# Patient Record
Sex: Female | Born: 1940 | Race: White | Hispanic: No | Smoking: Never smoker
Health system: Southern US, Community
[De-identification: ages and names within clinical notes are randomized; demographics above are authoritative.]

## PROBLEM LIST (undated history)

## (undated) DIAGNOSIS — E039 Hypothyroidism, unspecified: Secondary | ICD-10-CM

## (undated) DIAGNOSIS — K589 Irritable bowel syndrome without diarrhea: Secondary | ICD-10-CM

## (undated) DIAGNOSIS — J189 Pneumonia, unspecified organism: Secondary | ICD-10-CM

## (undated) DIAGNOSIS — R911 Solitary pulmonary nodule: Secondary | ICD-10-CM

## (undated) DIAGNOSIS — K5790 Diverticulosis of intestine, part unspecified, without perforation or abscess without bleeding: Secondary | ICD-10-CM

## (undated) DIAGNOSIS — E785 Hyperlipidemia, unspecified: Secondary | ICD-10-CM

## (undated) DIAGNOSIS — I499 Cardiac arrhythmia, unspecified: Secondary | ICD-10-CM

## (undated) DIAGNOSIS — K635 Polyp of colon: Secondary | ICD-10-CM

## (undated) HISTORY — DX: Hyperlipidemia, unspecified: E78.5

## (undated) HISTORY — DX: Solitary pulmonary nodule: R91.1

## (undated) HISTORY — DX: Pneumonia, unspecified organism: J18.9

## (undated) HISTORY — DX: Diverticulosis of intestine, part unspecified, without perforation or abscess without bleeding: K57.90

## (undated) HISTORY — DX: Polyp of colon: K63.5

## (undated) HISTORY — DX: Hypothyroidism, unspecified: E03.9

## (undated) HISTORY — DX: Cardiac arrhythmia, unspecified: I49.9

## (undated) HISTORY — DX: Irritable bowel syndrome without diarrhea: K58.9

---
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
## 1979-07-20 HISTORY — PX: ABDOMINAL HYSTERECTOMY: SHX81

## 1979-07-20 HISTORY — PX: APPENDECTOMY: SHX54

## 1988-07-19 HISTORY — PX: THYROID SURGERY: SHX805

## 1993-07-19 DIAGNOSIS — E039 Hypothyroidism, unspecified: Secondary | ICD-10-CM

## 1993-07-19 DIAGNOSIS — I499 Cardiac arrhythmia, unspecified: Secondary | ICD-10-CM

## 1993-07-19 HISTORY — DX: Cardiac arrhythmia, unspecified: I49.9

## 1993-07-19 HISTORY — DX: Hypothyroidism, unspecified: E03.9

## 1999-07-20 DIAGNOSIS — J189 Pneumonia, unspecified organism: Secondary | ICD-10-CM

## 1999-07-20 HISTORY — DX: Pneumonia, unspecified organism: J18.9

## 2000-07-19 HISTORY — PX: VEIN SURGERY: SHX48

## 2001-12-27 ENCOUNTER — Encounter: Payer: Self-pay | Admitting: Pulmonary Disease

## 2002-02-26 ENCOUNTER — Encounter: Payer: Self-pay | Admitting: Pulmonary Disease

## 2002-07-23 ENCOUNTER — Encounter: Payer: Self-pay | Admitting: Pulmonary Disease

## 2003-01-07 ENCOUNTER — Encounter: Payer: Self-pay | Admitting: Pulmonary Disease

## 2003-01-29 ENCOUNTER — Encounter: Payer: Self-pay | Admitting: Pulmonary Disease

## 2003-02-04 ENCOUNTER — Encounter (INDEPENDENT_AMBULATORY_CARE_PROVIDER_SITE_OTHER): Payer: Self-pay | Admitting: Specialist

## 2003-02-04 ENCOUNTER — Encounter: Payer: Self-pay | Admitting: Pulmonary Disease

## 2003-02-04 ENCOUNTER — Ambulatory Visit: Admission: RE | Admit: 2003-02-04 | Discharge: 2003-02-04 | Payer: Self-pay | Admitting: Pulmonary Disease

## 2003-02-04 ENCOUNTER — Encounter (INDEPENDENT_AMBULATORY_CARE_PROVIDER_SITE_OTHER): Payer: Self-pay

## 2003-07-20 DIAGNOSIS — E785 Hyperlipidemia, unspecified: Secondary | ICD-10-CM

## 2003-07-20 HISTORY — DX: Hyperlipidemia, unspecified: E78.5

## 2003-08-28 ENCOUNTER — Encounter: Payer: Self-pay | Admitting: Pulmonary Disease

## 2004-03-17 ENCOUNTER — Encounter: Payer: Self-pay | Admitting: Pulmonary Disease

## 2004-12-07 ENCOUNTER — Encounter: Payer: Self-pay | Admitting: Pulmonary Disease

## 2004-12-31 ENCOUNTER — Ambulatory Visit: Payer: Self-pay | Admitting: Pulmonary Disease

## 2005-05-21 ENCOUNTER — Ambulatory Visit: Payer: Self-pay | Admitting: Pulmonary Disease

## 2006-06-23 ENCOUNTER — Ambulatory Visit: Payer: Self-pay | Admitting: Pulmonary Disease

## 2007-05-25 ENCOUNTER — Ambulatory Visit: Payer: Self-pay | Admitting: Pulmonary Disease

## 2007-09-20 DIAGNOSIS — Z87898 Personal history of other specified conditions: Secondary | ICD-10-CM | POA: Insufficient documentation

## 2007-09-20 DIAGNOSIS — J984 Other disorders of lung: Secondary | ICD-10-CM

## 2007-09-20 DIAGNOSIS — J45909 Unspecified asthma, uncomplicated: Secondary | ICD-10-CM

## 2008-06-24 ENCOUNTER — Ambulatory Visit: Payer: Self-pay | Admitting: Pulmonary Disease

## 2008-06-24 DIAGNOSIS — Z2239 Carrier of other specified bacterial diseases: Secondary | ICD-10-CM | POA: Insufficient documentation

## 2008-07-04 ENCOUNTER — Telehealth (INDEPENDENT_AMBULATORY_CARE_PROVIDER_SITE_OTHER): Payer: Self-pay | Admitting: *Deleted

## 2009-06-23 ENCOUNTER — Ambulatory Visit: Payer: Self-pay | Admitting: Pulmonary Disease

## 2009-07-21 ENCOUNTER — Telehealth (INDEPENDENT_AMBULATORY_CARE_PROVIDER_SITE_OTHER): Payer: Self-pay | Admitting: *Deleted

## 2010-06-23 ENCOUNTER — Ambulatory Visit: Payer: Self-pay | Admitting: Pulmonary Disease

## 2010-08-18 NOTE — Progress Notes (Signed)
Summary: Advair RX  Phone Note Call from Patient Call back at [facility] Phone [phone]   Caller: Patient Call For: clance Reason for Call: Refill Medication, Talk to Nurse Summary of Call: dry cough gone, was on discus of Advair.  KC gave samples of Advair puffers and they work much better.  Need him to call her an RX in for Advair puffers. [facility] - [phone] Initial call taken by: Eugene Gavia,  July 21, 2009 8:23 AM  Follow-up for Phone Call        LMOMTCB---x1 [facility]ackery [facility]  July 21, 2009 12:37 PM  The patient says she is doing well on the Advair HFA 115/21, 2 puffs two times a day and would like this sent to her pharmacy. She states her cough is gone. Okay to send RX?Michel Bickers CMA  July 23, 2009 9:34 AM   Additional Follow-up for Phone Call Additional follow up Details #1::        ok with me...she can get a years worth. Additional Follow-up by: Barbaraann Share MD,  July 23, 2009 1:54 PM    Additional Follow-up for Phone Call Additional follow up Details #2::    LMOM to let pt know RX had been called to the pharmacy and she should call them to make sure it is ready before going to pick it up.Michel Bickers CMA  July 23, 2009 2:19 PM  New/Updated Medications: ADVAIR HFA 115-21 MCG/ACT AERO (FLUTICASONE-SALMETEROL) 2 puffs two times a day and rinse well Prescriptions: ADVAIR HFA 115-21 MCG/ACT AERO (FLUTICASONE-SALMETEROL) 2 puffs two times a day and rinse well  #1 x 11   Entered by:   Michel Bickers CMA   Authorized by:   Barbaraann Share MD   Signed by:   Michel Bickers CMA on 07/23/2009   Method used:   Telephoned to ...         RxID:   [id number]

## 2010-08-20 NOTE — Assessment & Plan Note (Signed)
Summary: rov for asthma, nodules.   Visit Type:  Follow-up Primary Provider/Referring Provider:  Gae Gallop [facility])  CC:  1 year follow up. pt states her breathing has been doing pretty good. pt denies any cough. pt states she quit the advair due to her mouth was raw and it was irritating. pt is never smoker.  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  She has been doing very well from a breathing standpoint, and has discontinued using her advair due to ongoing thrush despite rinsing.  She has seen no change in her breathing, and has not used her rescue inhaler.  Her exertional tolerance is at baseline.  She has denied cough, congestion, or purulence.  She has been eating well, and is not losing significant weight .  She is due for a f/u cxr for her h/o nodules.  Current Medications (verified): 1)  Celebrex 200 Mg  Caps (Celecoxib) .... Take 1 Tablet By Mouth Once A Day 2)  Allegra 180 Mg  Tabs (Fexofenadine Hcl) .... Take 1 Tablet By Mouth Once A Day 3)  Synthroid 75 Mcg  Tabs (Levothyroxine Sodium) .... Take 1 Tablet By Mouth Once A Day 4)  Vitamin D 1000 Unit Caps (Cholecalciferol) .... Take 1 Tablet By Mouth Once A Day 5)  Ventolin Hfa 108 (90 Base) Mcg/act  Aers (Albuterol Sulfate) .Marland Kitchen.. 1-2 Puffs Every 4-6 Hours As Needed 6)  Tessalon Perles 100 Mg  Caps (Benzonatate) .... One To Two By Mouth 3-4 Times Daily If Needed For Cough  Allergies (verified): No Known Drug Allergies  Review of Systems       The patient complains of shortness of breath with activity, irregular heartbeats, acid heartburn, indigestion, and joint stiffness or pain.  The patient denies shortness of breath at rest, non-productive cough, coughing up blood, chest pain, loss of appetite, weight change, abdominal pain, difficulty swallowing, sore throat, tooth/dental problems, headaches, nasal congestion/difficulty  breathing through nose, sneezing, itching, ear ache, anxiety, depression, hand/feet swelling, rash, change in color of mucus, and fever.    Vital Signs:  Patient profile:   70 year old female Height:      68.5 inches Weight:      160.50 pounds BMI:     24.14 O2 Sat:      98 % on Room air Temp:     97.5 degrees F oral Pulse rate:   65 / minute BP sitting:   118 / 76  (left arm) Cuff size:   regular  Vitals Entered By: Carver Fila (June 23, 2010 8:52 AM)  O2 Flow:  Room air CC: 1 year follow up. pt states her breathing has been doing pretty good. pt denies any cough. pt states she quit the advair due to her mouth was raw and it was irritating. pt is never smoker Comments meds and allergies updated Phone number updated Carver Fila  June 23, 2010 8:52 AM    Physical Exam  General:  wd female in nad Lungs:  totally clear to auscultation no wheezing Heart:  rrr, no mrg Extremities:  no edema or cyanosis  Neurologic:  alert and oriented, moves all 4.   Impression & Recommendations:  Problem # 1:  ASTHMA (ICD-493.90) the pt has been asymptomatic since discontinuing advair.  I am willing to give her time off the med as long as symptoms do not recur, and have asked  her to play close attention.  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  They have not changed on serial cxr's.  Will check one today, and consider whether she needs ongoing f/u.    Other Orders: Est. Patient Level III (13086) T-2 View CXR (71020TC)  Patient Instructions: 1)  ok to stay off advair and use albuterol as needed.  However, pay close attention to your symptoms, and let me know if increasing. 2)  will check cxr today, and call with results 3)  followup with me in one year.   Immunization History:  Influenza Immunization History:     Influenza:  historical (05/19/2010)

## 2010-10-18 HISTORY — PX: BLADDER SUSPENSION: SHX72

## 2010-12-04 NOTE — Op Note (Signed)
   NAME:  Sophia Patterson, Sophia Patterson NO.:  [id number]   MEDICAL RECORD NO.:  [id number]                   PATIENT TYPE:  AMB   LOCATION:  CARD                                 FACILITY:  [facility]   PHYSICIAN:  Marcelyn Bruins, M.D. [facility]              DATE OF BIRTH:  [birth year]   DATE OF PROCEDURE:  02/04/2003  DATE OF DISCHARGE:                                 OPERATIVE REPORT   OPERATION/PROCEDURE:  Flexible fiberoptic bronchoscopy with bronchoalveolar  lavage and transbronchial lung biopsy and transbronchial lung biopsies.   INDICATIONS:  Worsening right upper lobe nodular air space disease of  unknown etiology.   SURGEON:  Marcelyn Bruins, M.D.   ANESTHESIA:  Demerol 2 mg IV, Versed 10 mg IV, topical 1% lidocaine through  the airways during the procedure.   DESCRIPTION OF PROCEDURE:  After obtaining informed consent and under close  cardiopulmonary monitoring, the above preop anesthesia was given and the  fiberoptic scope was passed through the right naris  and into the posterior  pharynx where there were no lesions other abnormalities seen.  The vocal  cords appeared to be within normal limits and bilaterally on phonation.  The  scope was then passed into the trachea where it was examined along its  entire length down to the level of the carina all of which was normal.  The  left tracheobronchial tree was examined at the subsegmental level with no  endobronchial process being found.  The right tracheobronchial tree then  examined serially with no endobronchial abnormality but there was some  mildly purulent secretions coming from the posterior segment of the right  upper lobe.  This was also the area on the x-ray of the abnormality.  Bronchoalveolar lavage x2 was done from the posterior segment of the right  upper lobe and then followed by transbronchial biopsies x3 under  fluoroscopic guidance.  Good hemostasis was maintained. The patient  maintained good oxygen  saturation and blood pressure throughout the  procedure.  There was no obvious pneumothorax by fluoroscopy after the  procedure but certainly a chest x-ray will be done to exclude this post  biopsy. Overall the patient tolerated the procedure well and there were no  complications.                                                  Marcelyn Bruins, M.D. [facility]    KC/MEDQ  D:  02/04/2003  T:  02/04/2003  Job:  [id number]

## 2011-06-22 ENCOUNTER — Encounter: Payer: Self-pay | Admitting: Pulmonary Disease

## 2011-06-23 ENCOUNTER — Encounter: Payer: Self-pay | Admitting: Pulmonary Disease

## 2011-06-23 ENCOUNTER — Ambulatory Visit (INDEPENDENT_AMBULATORY_CARE_PROVIDER_SITE_OTHER): Payer: Medicare Other | Admitting: Pulmonary Disease

## 2011-06-23 DIAGNOSIS — A310 Pulmonary mycobacterial infection: Secondary | ICD-10-CM

## 2011-06-23 DIAGNOSIS — J45909 Unspecified asthma, uncomplicated: Secondary | ICD-10-CM

## 2011-06-23 DIAGNOSIS — J984 Other disorders of lung: Secondary | ICD-10-CM

## 2011-06-23 NOTE — Assessment & Plan Note (Signed)
The patient is doing very well off a maintenance inhaler, and she rarely uses albuterol.  She has had no acute exacerbation, and is staying very active.  At this point, I have asked her to stay off her maintenance inhaler, and continue to use albuterol only as needed.  However, she needs to call if she has increased dyspnea on exertion or if she is having to use her rescue inhaler more consistently.

## 2011-06-23 NOTE — Progress Notes (Signed)
  Subjective:    Patient ID: Sophia Patterson, female    DOB: 1940/09/10, 70 y.o.   MRN: [id number]  HPI The patient comes in today for followup of her known mild airflow obstruction, felt to be due to  possible asthma.  She has had issues with inhaled corticosteroids in the past, and in the last visit I asked her to discontinue her maintenance medication.  She has had no change in her exertional tolerance, no acute exacerbations, and has rarely used her rescue inhaler.  She also has a history of MAC, but has been eating well with no weight loss.  She also denies cough with mucus production.   Review of Systems  Constitutional: Negative for fever and unexpected weight change.  HENT: Negative for ear pain, nosebleeds, congestion, sore throat, rhinorrhea, sneezing, trouble swallowing, dental problem, postnasal drip and sinus pressure.   Eyes: Negative for redness and itching.  Respiratory: Negative for cough, chest tightness, shortness of breath and wheezing.   Cardiovascular: Negative for palpitations and leg swelling.  Gastrointestinal: Negative for nausea and vomiting.  Genitourinary: Negative for dysuria.  Musculoskeletal: Negative for joint swelling.  Skin: Negative for rash.  Neurological: Negative for headaches.  Hematological: Does not bruise/bleed easily.  Psychiatric/Behavioral: Negative for dysphoric mood. The patient is not nervous/anxious.        Objective:   Physical Exam Well-developed female in no acute distress Nose with no purulence or discharge noted Chest totally clear to auscultation Heart exam with regular rate and rhythm Lower extremities without edema, no cyanosis Alert and oriented, moves all 4 extremities.       Assessment & Plan:

## 2011-06-23 NOTE — Patient Instructions (Signed)
No change in meds.  Please call if increased albuterol use or if change in appetite/weight followup with me in one year.

## 2011-06-23 NOTE — Assessment & Plan Note (Signed)
No weight loss or anorexia.  Feels she is doing well.  Will hold off on cxr this visit.

## 2012-02-02 DIAGNOSIS — K589 Irritable bowel syndrome without diarrhea: Secondary | ICD-10-CM

## 2012-02-02 DIAGNOSIS — K5790 Diverticulosis of intestine, part unspecified, without perforation or abscess without bleeding: Secondary | ICD-10-CM

## 2012-02-02 DIAGNOSIS — K635 Polyp of colon: Secondary | ICD-10-CM

## 2012-02-02 HISTORY — DX: Irritable bowel syndrome, unspecified: K58.9

## 2012-02-02 HISTORY — DX: Polyp of colon: K63.5

## 2012-02-02 HISTORY — DX: Diverticulosis of intestine, part unspecified, without perforation or abscess without bleeding: K57.90

## 2012-06-20 ENCOUNTER — Ambulatory Visit: Payer: Medicare Other | Admitting: Pulmonary Disease

## 2012-07-04 ENCOUNTER — Encounter: Payer: Self-pay | Admitting: Pulmonary Disease

## 2012-07-04 ENCOUNTER — Ambulatory Visit (INDEPENDENT_AMBULATORY_CARE_PROVIDER_SITE_OTHER)
Admission: RE | Admit: 2012-07-04 | Discharge: 2012-07-04 | Disposition: A | Discharge status: Home / Self Care | Payer: Medicare Other | Source: Ambulatory Visit | Attending: Pulmonary Disease | Admitting: Pulmonary Disease

## 2012-07-04 ENCOUNTER — Telehealth: Payer: Self-pay | Admitting: *Deleted

## 2012-07-04 ENCOUNTER — Ambulatory Visit (INDEPENDENT_AMBULATORY_CARE_PROVIDER_SITE_OTHER): Payer: Medicare Other | Admitting: Pulmonary Disease

## 2012-07-04 VITALS — BP 102/68 | HR 75 | Temp 97.3°F | Ht 68.5 in | Wt 158.4 lb

## 2012-07-04 DIAGNOSIS — A310 Pulmonary mycobacterial infection: Secondary | ICD-10-CM

## 2012-07-04 DIAGNOSIS — J984 Other disorders of lung: Secondary | ICD-10-CM

## 2012-07-04 DIAGNOSIS — J45909 Unspecified asthma, uncomplicated: Secondary | ICD-10-CM

## 2012-07-04 NOTE — Assessment & Plan Note (Signed)
The patient is stable on albuterol alone, and is not having any breathing issues.  I have asked her to continue on this, but to let us know if she is requiring more frequent use of her rescue inhaler.

## 2012-07-04 NOTE — Patient Instructions (Addendum)
Will check chest xray today, and call you with results. Continue on as needed albuterol, but if requiring more often, may have to consider going back on maintenance medications. followup with me in one year.

## 2012-07-04 NOTE — Telephone Encounter (Signed)
Radiology asks that patient cxr is reviewed ASAP. They are recommending patient have a CT of chest. I have printed results and placed them on your desk for review.   Dr Shelle Iron please advise. Thanks.

## 2012-07-04 NOTE — Assessment & Plan Note (Signed)
The patient has a history of pulmonary nodules, that I suspect is secondary to MAC.  She is due for her followup at this time.  She denies any worsening constitutional symptoms.

## 2012-07-04 NOTE — Progress Notes (Signed)
  Subjective:    Patient ID: Sophia Patterson, female    DOB: Feb 16, 1941, 71 y.o.   MRN: [id number]  HPI Patient comes in today for followup of her history of asthma and pulmonary nodules.  The last visit, we took her off maintenance medication because of ongoing issues with dysphonia and thrush.  She has not had any breathing issues, and rarely require her rescue inhaler.  She has had no significant cough or mucus, has not lost weight, and is eating well.  She is due for her followup chest x-ray today.   Review of Systems  Constitutional: Negative for fever and unexpected weight change.  HENT: Negative for ear pain, nosebleeds, congestion, sore throat, rhinorrhea, sneezing, trouble swallowing, dental problem, postnasal drip and sinus pressure.   Eyes: Negative for redness and itching.  Respiratory: Negative for cough, chest tightness, shortness of breath and wheezing.   Cardiovascular: Negative for palpitations and leg swelling.  Gastrointestinal: Negative for nausea and vomiting.  Genitourinary: Negative for dysuria.  Musculoskeletal: Positive for arthralgias. Negative for joint swelling.       Arthritis  Skin: Negative for rash.  Neurological: Positive for headaches.  Hematological: Bruises/bleeds easily.  Psychiatric/Behavioral: Negative for dysphoric mood. The patient is not nervous/anxious.        Objective:   Physical Exam Well-developed female in no acute distress Nose without purulence or discharge noted Neck without lymphadenopathy or thyromegaly Chest with fairly clear breath sounds, no wheezing Cardiac exam with regular rate and rhythm Lower extremities without edema, no cyanosis Alert and oriented, moves all 4 extremities.       Assessment & Plan:

## 2012-07-14 NOTE — Telephone Encounter (Signed)
Pt returned call and is aware KC is out of the office until Thurs., 07/20/12. She says he can try to reach her once he returns and her contact numbers are:  [phone] or [phone]. I have updated her contact numbers for future calls.

## 2012-07-14 NOTE — Telephone Encounter (Signed)
LMTCB

## 2012-07-14 NOTE — Telephone Encounter (Signed)
Returning call.Sophia Patterson ° °

## 2012-07-14 NOTE — Telephone Encounter (Signed)
Pt states she's returning call can be reached at [phone].Sophia Patterson

## 2012-07-20 ENCOUNTER — Other Ambulatory Visit: Payer: Self-pay | Admitting: Pulmonary Disease

## 2012-07-20 DIAGNOSIS — J984 Other disorders of lung: Secondary | ICD-10-CM

## 2012-07-20 NOTE — Telephone Encounter (Signed)
done

## 2012-07-20 NOTE — Telephone Encounter (Signed)
Pt states she is returning KC's call.  Pt can be reached at [phone].  Sophia Patterson

## 2012-07-27 ENCOUNTER — Telehealth: Payer: Self-pay | Admitting: *Deleted

## 2012-07-27 NOTE — Telephone Encounter (Signed)
Patient had recent CT 07-25-12.Marland KitchenMarland KitchenCT scan disc and report was delivered to office this morning. Placed in your Makynna Manocchio folder for review.   Dr Shelle Iron please advise. Thanks.

## 2012-08-02 ENCOUNTER — Encounter: Payer: Self-pay | Admitting: Pulmonary Disease

## 2012-08-02 NOTE — Telephone Encounter (Signed)
Discussed results with pt.  She does have tiny nodular infiltrates similar to in past, and most likely due to MAC.  The RML atx is new, but the airways appear patent proximally.  No evidence for mass or mucus plug.  It appears more due to bronchiectasis with inflammatory changes. Will do a repeat ct in 6mos since the pt is asymptomatic.  She is to call me in 5 mos to set up .

## 2012-08-08 ENCOUNTER — Encounter: Payer: Self-pay | Admitting: Pulmonary Disease

## 2012-08-08 ENCOUNTER — Ambulatory Visit (INDEPENDENT_AMBULATORY_CARE_PROVIDER_SITE_OTHER): Payer: Medicare Other | Admitting: Pulmonary Disease

## 2012-08-08 VITALS — BP 130/80 | HR 70 | Temp 97.5°F | Ht 68.0 in | Wt 163.0 lb

## 2012-08-08 DIAGNOSIS — J479 Bronchiectasis, uncomplicated: Secondary | ICD-10-CM | POA: Insufficient documentation

## 2012-08-08 DIAGNOSIS — J9819 Other pulmonary collapse: Secondary | ICD-10-CM

## 2012-08-08 DIAGNOSIS — J9811 Atelectasis: Secondary | ICD-10-CM

## 2012-08-08 DIAGNOSIS — J984 Other disorders of lung: Secondary | ICD-10-CM

## 2012-08-08 NOTE — Progress Notes (Signed)
  Subjective:    Patient ID: Sophia Patterson, female    DOB: 06/10/1941, 72 y.o.   MRN: [id number]  HPI Pt presents to discuss ct findings, son with her.    Review of Systems  Constitutional: Negative for fever and unexpected weight change.  HENT: Negative for ear pain, nosebleeds, congestion, sore throat, rhinorrhea, sneezing, trouble swallowing, dental problem, postnasal drip and sinus pressure.   Eyes: Negative for redness and itching.  Respiratory: Negative for cough, chest tightness, shortness of breath and wheezing.   Cardiovascular: Negative for palpitations and leg swelling.  Gastrointestinal: Negative for nausea and vomiting.  Genitourinary: Positive for frequency. Negative for dysuria.  Musculoskeletal: Positive for joint swelling and arthralgias ( arthritis).  Skin: Negative for rash.  Neurological: Negative for headaches.  Hematological: Does not bruise/bleed easily.  Psychiatric/Behavioral: Negative for dysphoric mood. The patient is not nervous/anxious.        Objective:   Physical Exam        Assessment & Plan:

## 2012-08-08 NOTE — Assessment & Plan Note (Signed)
No significant change on recent ct.  Will continue to follow.

## 2012-08-08 NOTE — Patient Instructions (Addendum)
Will do a followup ct chest in 6mos as previously discussed. Please call if any increase in mucus, chest congestion, persistent cough, weight loss/anorexia

## 2012-08-28 ENCOUNTER — Encounter: Payer: Self-pay | Admitting: Pulmonary Disease

## 2012-12-25 ENCOUNTER — Telehealth: Payer: Self-pay | Admitting: Pulmonary Disease

## 2012-12-25 NOTE — Telephone Encounter (Signed)
Ct chest scheduled at [facility] in [place] 01/03/13@11 :00am pt is aware Tobe Sos

## 2012-12-25 NOTE — Telephone Encounter (Signed)
Order is in EPIC PCC's placed from feb. Please advise thanks

## 2013-01-23 ENCOUNTER — Telehealth: Payer: Self-pay | Admitting: Pulmonary Disease

## 2013-01-23 NOTE — Telephone Encounter (Signed)
ATC patient no answer LMOMTCB 

## 2013-01-24 NOTE — Telephone Encounter (Signed)
Pt states that she had 6 mth repeat CT scan done on 01-03-13 at [facility] imaging in [place] [phone]) and she wants to knon if Dr Shelle Iron received this disc and wants the results.  Please advise.

## 2013-01-26 NOTE — Telephone Encounter (Signed)
I do not have any records or disc on this patient. Will call Monday morning to inquire. CT scan done on 01-03-13 at [facility] imaging in [place] [phone])

## 2013-01-29 NOTE — Telephone Encounter (Signed)
Have already seen disc and lmom for her.

## 2013-01-29 NOTE — Telephone Encounter (Signed)
Disc has been received and reviewed by Dr Shelle Iron. Disc is ready to be placed in patient records.   Pt is calling wanting to know the results of this CT. Dr Shelle Iron, please advise. Thanks.

## 2013-01-30 ENCOUNTER — Telehealth: Payer: Self-pay | Admitting: Pulmonary Disease

## 2013-01-30 NOTE — Telephone Encounter (Signed)
I have tried to call pt numerous times past 2 days on home and mobile without success.  Have left messages for her to call us and give a number where she can be reached, and what is the best time.   Her ct shows changes of bronchiectasis, with ?MAC colonization.  She does not require any further f/u for her nodules.

## 2013-01-31 NOTE — Telephone Encounter (Signed)
Pt is returning KC's call & can be reached at [phone].  Sophia Patterson

## 2013-02-02 ENCOUNTER — Telehealth: Payer: Self-pay | Admitting: Pulmonary Disease

## 2013-02-02 NOTE — Telephone Encounter (Signed)
Pt is out of town until tuesday. KC has been trying to reach patient. she would like for him to call her on tuesday to give her the results. She is with a friend at a hospital in [place]. She will not be able to talk to him now.  Contact # [phone] (H)

## 2013-02-05 NOTE — Telephone Encounter (Signed)
Spoke with pt

## 2013-02-07 ENCOUNTER — Encounter: Payer: Self-pay | Admitting: Pulmonary Disease

## 2013-07-02 ENCOUNTER — Ambulatory Visit: Payer: No Typology Code available for payment source | Admitting: Pulmonary Disease

## 2013-08-02 ENCOUNTER — Ambulatory Visit (INDEPENDENT_AMBULATORY_CARE_PROVIDER_SITE_OTHER): Payer: Medicare Other | Admitting: Pulmonary Disease

## 2013-08-02 ENCOUNTER — Encounter: Payer: Self-pay | Admitting: Pulmonary Disease

## 2013-08-02 ENCOUNTER — Ambulatory Visit (INDEPENDENT_AMBULATORY_CARE_PROVIDER_SITE_OTHER)
Admission: RE | Admit: 2013-08-02 | Discharge: 2013-08-02 | Disposition: A | Discharge status: Home / Self Care | Payer: Medicare Other | Source: Ambulatory Visit | Attending: Pulmonary Disease | Admitting: Pulmonary Disease

## 2013-08-02 VITALS — BP 122/70 | HR 58 | Temp 97.5°F | Ht 68.0 in | Wt 156.4 lb

## 2013-08-02 DIAGNOSIS — A310 Pulmonary mycobacterial infection: Secondary | ICD-10-CM

## 2013-08-02 DIAGNOSIS — J984 Other disorders of lung: Secondary | ICD-10-CM

## 2013-08-02 DIAGNOSIS — J45909 Unspecified asthma, uncomplicated: Secondary | ICD-10-CM

## 2013-08-02 DIAGNOSIS — J9819 Other pulmonary collapse: Secondary | ICD-10-CM

## 2013-08-02 DIAGNOSIS — J9811 Atelectasis: Secondary | ICD-10-CM

## 2013-08-02 NOTE — Assessment & Plan Note (Signed)
The patient is doing very well from a pulmonary standpoint on no maintenance medications. She does have subtle airflow obstruction that is probably secondary to asthma, but she has had no issues at all. I've asked her to continue on a consistent exercise program, and to followup with me in one year if doing well.

## 2013-08-02 NOTE — Patient Instructions (Addendum)
Will check cxr today and call with results.  Work on exercise 3 times a week. followup with me again in one year.

## 2013-08-02 NOTE — Progress Notes (Signed)
   Subjective:    Patient ID: Sophia Patterson, female    DOB: 1940-10-10, 73 y.o.   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 She is doing very well from a pulmonary standpoint with no significant breathing issues. She has had no chest congestion or mucus production. She has a mild dry cough that is really not very bothersome to her. She has been eating well and has not been losing weight.   Review of Systems  Constitutional: Negative for fever and unexpected weight change.  HENT: Positive for postnasal drip. Negative for congestion, dental problem, ear pain, nosebleeds, rhinorrhea, sinus pressure, sneezing, sore throat and trouble swallowing.   Eyes: Negative for redness and itching.  Respiratory: Negative for cough, chest tightness, shortness of breath and wheezing.   Cardiovascular: Negative for palpitations and leg swelling.  Gastrointestinal: Negative for nausea and vomiting.  Endocrine: Positive for polyuria.  Genitourinary: Negative for dysuria.  Musculoskeletal: Negative for joint swelling.  Skin: Negative for rash.  Neurological: Negative for headaches.  Hematological: Bruises/bleeds easily.  Psychiatric/Behavioral: Negative for dysphoric mood. The patient is not nervous/anxious.        Objective:   Physical Exam Well-developed female in no acute distress Nose without purulence or discharge noted Neck without lymphadenopathy or thyromegaly Chest totally clear to auscultation Heart exam with regular rate and rhythm Lower extremities without edema, no cyanosis Alert and oriented, moves all 4 extremities.       Assessment & Plan:

## 2013-08-02 NOTE — Assessment & Plan Note (Signed)
The patient has a history of right middle lobe atelectasis that I suspect is secondary to bronchiectasis.  Her central airways are completely patent, and there is no suggestion for an endobronchial obstruction. She has not had any recurrent infections, and has a minimal cough.

## 2013-08-06 ENCOUNTER — Telehealth: Payer: Self-pay | Admitting: Pulmonary Disease

## 2013-08-06 NOTE — Telephone Encounter (Signed)
Pt says she can be reached at [phone].Sophia EvertsJuanita S Patterson

## 2013-08-06 NOTE — Telephone Encounter (Signed)
Result Notes    Notes Recorded by Maisie FusAshtyn M Green, CMA on 08/03/2013 at 10:43 AM LMOMTCB X 1 ------  Notes Recorded by Barbaraann ShareKeith M Clance, MD on 08/02/2013 at 12:37 PM Please let pt know that her cxr is completely stable from prior. I think the area in her right middle lobe is actually better.   Pt aware of results.

## 2013-08-06 NOTE — Telephone Encounter (Signed)
Error.Sophia Patterson ° °

## 2013-08-27 ENCOUNTER — Encounter: Payer: Self-pay | Admitting: Internal Medicine

## 2013-09-26 ENCOUNTER — Ambulatory Visit (INDEPENDENT_AMBULATORY_CARE_PROVIDER_SITE_OTHER): Payer: Medicare Other | Admitting: Internal Medicine

## 2013-09-26 ENCOUNTER — Encounter: Payer: Self-pay | Admitting: Internal Medicine

## 2013-09-26 VITALS — BP 124/80 | HR 72 | Ht 67.75 in | Wt 151.5 lb

## 2013-09-26 DIAGNOSIS — R1084 Generalized abdominal pain: Secondary | ICD-10-CM

## 2013-09-26 DIAGNOSIS — R112 Nausea with vomiting, unspecified: Secondary | ICD-10-CM

## 2013-09-26 DIAGNOSIS — K589 Irritable bowel syndrome without diarrhea: Secondary | ICD-10-CM

## 2013-09-26 DIAGNOSIS — R933 Abnormal findings on diagnostic imaging of other parts of digestive tract: Secondary | ICD-10-CM

## 2013-09-26 NOTE — Progress Notes (Signed)
HISTORY OF PRESENT ILLNESS:  Sophia Patterson is a 73 y.o. female with multiple medical problems as listed below. She is self referred, accompanied by her sister Sophia Patterson(Sophia Patterson), regarding a recent episode of severe lower abdominal pain. The patient carries a diagnosis of chronic irritable bowel syndrome for years. She treats her intermittent lower abdominal pain with dicyclomine and fiber. [facility] [place]. She has a local gastroenterologist, Dr. Chales AbrahamsGupta. I am told that she had colonoscopy in July of 2013 with diverticulosis only. She has had a prior remote episode of diverticulitis. Apparently has chronic urologic problems with recurrent UTIs and has had a bladder sling received your. Other relevant surgeries include hysterectomy and appendectomy. For history of abdominal pain she underwent CT scan of the abdomen and pelvis 08/20/2013. No acute findings. Questionable constipation. A mention of cholelithiasis in the conclusion but not report of body. Subsequent to that she had episode of severe lower abdominal pain lasting 5 hours. This was associated with diaphoresis and vomiting. Vomited black liquid but no coffee grounds or blood. After that problem resolves, no recurrence. No previous history of similar issues. Her sister thought it would be important for me to evaluate her regarding this issue. Available outside records reviewed. Additional outside records requested  REVIEW OF SYSTEMS:  All non-GI ROS negative except for back pain, fatigue, urinary leakage  Past Medical History  Diagnosis Date  . Pulmonary nodule   . Migraine   . Asthma 2005  . Cardiac arrhythmia 1995  . Colon polyps 02/02/2012  . Diverticulosis 02/02/2012  . HLD (hyperlipidemia) 2005  . Hypothyroidism 1995  . IBS (irritable bowel syndrome) 02/02/2012  . Pneumonia 2001    Past Surgical History  Procedure Laterality Date  . Appendectomy  1981  . Abdominal hysterectomy  1981   . Bladder suspension  10/2010    with stent  . Thyroid surgery  1990  . Vein surgery  2002  . Cyst excision  1962    from spine    Social History Sophia Patterson  reports that she has never smoked. She has never used smokeless tobacco. She reports that she drinks alcohol. She reports that she does not use illicit drugs.  family history includes Alzheimer's disease in her mother; Heart disease in her brother, maternal grandfather, maternal grandmother, mother, paternal grandmother, and sister; Irritable bowel syndrome in her brother; Kidney cancer in her father; Stroke in her father.  Allergies  Allergen Reactions  . Morphine And Related     Effects breathing       PHYSICAL EXAMINATION: Vital signs: BP 124/80  Pulse 72  Ht 5' 7.75" (1.721 m)  Wt 151 lb 8 oz (68.72 kg)  BMI 23.20 kg/m2  Constitutional: generally well-appearing, no acute distress Psychiatric: alert and oriented x3, cooperative Eyes: extraocular movements intact, anicteric, conjunctiva pink Mouth: oral pharynx moist, no lesions Neck: supple no lymphadenopathy Cardiovascular: heart regular rate and rhythm, no murmur Lungs: clear to auscultation bilaterally Abdomen: soft, nontender, nondistended, no obvious ascites, no peritoneal signs, normal bowel sounds, no organomegaly. Surgical incisions well-healed Rectal: Omitted Extremities: no lower extremity edema bilaterally Skin: no lesions on visible extremities Neuro: No focal deficits.  ASSESSMENT:  #1. Recent episode of lower abdominal pain as described. Possible causes include exacerbation of IBS, constipation, transient partial bowel obstruction, or gallstones (if present) #2. IBS with alternating bowel habits. Chronic. #3. Questionable abnormal CT imaging as described above #4. Diverticulosis with remote history of diverticulitis. Not present  currently #5. Chronic urologic issues   PLAN:  #1. Continue fiber #2. Continue antispasmodics #3. Obtain  ultrasound to rule out gallstones #4. Observe for recurrent pain. Otherwise, expectant management. #5. Return to the care of your local physicians. GI followup. As needed

## 2013-09-26 NOTE — Patient Instructions (Signed)
You have been scheduled for an abdominal ultrasound at [facility] (1st floor of hospital) on 09/27/13 at 9:00am. Please arrive 15 minutes prior to your appointment for registration. Make certain not to have anything to eat or drink 6 hours prior to your appointment. Should you need to reschedule your appointment, please contact radiology at [phone]. This test typically takes about 30 minutes to perform.

## 2013-09-27 ENCOUNTER — Ambulatory Visit (HOSPITAL_COMMUNITY)
Admission: RE | Admit: 2013-09-27 | Discharge: 2013-09-27 | Disposition: A | Discharge status: Home / Self Care | Payer: Medicare Other | Source: Ambulatory Visit | Attending: Internal Medicine | Admitting: Internal Medicine

## 2013-09-27 DIAGNOSIS — K8020 Calculus of gallbladder without cholecystitis without obstruction: Secondary | ICD-10-CM | POA: Insufficient documentation

## 2013-09-27 DIAGNOSIS — R1084 Generalized abdominal pain: Secondary | ICD-10-CM

## 2013-09-27 DIAGNOSIS — R109 Unspecified abdominal pain: Secondary | ICD-10-CM | POA: Insufficient documentation

## 2014-01-17 ENCOUNTER — Encounter: Payer: Self-pay | Admitting: Pulmonary Disease

## 2014-01-17 ENCOUNTER — Ambulatory Visit (INDEPENDENT_AMBULATORY_CARE_PROVIDER_SITE_OTHER): Payer: Medicare Other | Admitting: Pulmonary Disease

## 2014-01-17 VITALS — BP 124/86 | HR 62 | Temp 97.4°F | Ht 68.5 in | Wt 153.2 lb

## 2014-01-17 DIAGNOSIS — J9819 Other pulmonary collapse: Secondary | ICD-10-CM

## 2014-01-17 DIAGNOSIS — A310 Pulmonary mycobacterial infection: Secondary | ICD-10-CM

## 2014-01-17 DIAGNOSIS — J9811 Atelectasis: Secondary | ICD-10-CM

## 2014-01-17 DIAGNOSIS — J45909 Unspecified asthma, uncomplicated: Secondary | ICD-10-CM

## 2014-01-17 NOTE — Assessment & Plan Note (Signed)
Based on the patient's history, I suspect she had a flare of her airways disease with her recent infection. She was treated with a course of prednisone and saw an improvement. Because her airways disease is so mild, and she has been intolerant of inhaled corticosteroids, we have not treated her asthma very aggressively. I do think she needs to have a rescue inhaler handy in the event of a pulmonary infection, and have given her a sample of proair today.

## 2014-01-17 NOTE — Assessment & Plan Note (Signed)
Her chest x-ray is fairly clear except for her right middle lobe changes. Therefore, I suspect that her MAC is still not an issue for her.

## 2014-01-17 NOTE — Progress Notes (Signed)
   Subjective:    Patient ID: Sophia Patterson, female    DOB: 10-19-1940, 73 y.o.   MRN: [id number]  HPI The patient comes in today for an acute sick visit. She has known right middle lobe bronchiectasis with chronic radiographic changes, as well as MAC colonization. She also has mild airways disease that I suspect is asthma, but she is intolerant of inhaled corticosteroids. She was in her usual state of health until May of this year when she began to develop cough, congestion, and purulent mucus and low-grade fever. Her chest x-ray showed a right middle lobe infiltrate, however she has known chronic right middle lobe atelectasis/bronchiectasis. It did appear worse than her x-ray last year. She has been treated with multiple rounds of antibiotics as well as prednisone, and finally feels that she is returning to her baseline. She feels that she is about 80-85% improved. She has very little cough at this time, and feels that her breathing has gotten better. From her description I suspect she had an asthma flare associated with her infection. She has had a followup chest x-ray that looks very similar to her film last year.   Review of Systems  Constitutional: Positive for fatigue. Negative for fever and unexpected weight change.  HENT: Negative for congestion, dental problem, ear pain, nosebleeds, postnasal drip, rhinorrhea, sinus pressure, sneezing, sore throat and trouble swallowing.   Eyes: Negative for redness and itching.  Respiratory: Negative for cough, chest tightness, shortness of breath and wheezing.   Cardiovascular: Negative for palpitations and leg swelling.  Gastrointestinal: Negative for nausea and vomiting.  Genitourinary: Negative for dysuria.  Musculoskeletal: Negative for joint swelling.  Skin: Negative for rash.  Neurological: Negative for headaches.  Hematological: Does not bruise/bleed easily.  Psychiatric/Behavioral: Negative for dysphoric mood. The patient is not  nervous/anxious.        Objective:   Physical Exam Well-developed female in no acute distress Nose without purulence or discharge noted Neck without lymphadenopathy or thyromegaly Chest totally clear to auscultation, no wheezing Heart exam with regular rate and rhythm Lower extremities without edema, no cyanosis Alert and oriented, moves all 4 extremities.       Assessment & Plan:

## 2014-01-17 NOTE — Patient Instructions (Signed)
Will give you a rescue inhaler to have available in the event of breathing issues.  Use proair 2 puffs every 6 hrs if needed for rescue. If this episode becomes a recurring theme, we may need to look at more aggressive treatment of your airways disease, and find a medication that you can tolerate. Please call if any worsening of symptoms, and will see you back at your already scheduled apptm

## 2014-01-17 NOTE — Assessment & Plan Note (Signed)
The patient has chronic right middle lobe atelectasis secondary to bronchiectasis. Her most recent episode was most likely a flare in this area, and she finally responded to antibiotics. Her followup chest x-ray shows improvement from her initial film, and it is very similar to a chest x-ray last year. Therefore, I think she is back to her baseline radiographically. She understands that she is at risk for recurrent infections in this area, and unfortunately there is not a lot we can do.

## 2014-08-02 ENCOUNTER — Ambulatory Visit: Payer: Medicare Other | Admitting: Pulmonary Disease

## 2014-08-15 ENCOUNTER — Ambulatory Visit: Payer: Medicare Other | Admitting: Pulmonary Disease

## 2014-08-29 ENCOUNTER — Encounter: Payer: Self-pay | Admitting: Pulmonary Disease

## 2014-08-29 ENCOUNTER — Ambulatory Visit (INDEPENDENT_AMBULATORY_CARE_PROVIDER_SITE_OTHER): Payer: Medicare Other | Admitting: Pulmonary Disease

## 2014-08-29 ENCOUNTER — Ambulatory Visit (INDEPENDENT_AMBULATORY_CARE_PROVIDER_SITE_OTHER)
Admission: RE | Admit: 2014-08-29 | Discharge: 2014-08-29 | Disposition: A | Discharge status: Home / Self Care | Payer: Medicare Other | Source: Ambulatory Visit | Attending: Pulmonary Disease | Admitting: Pulmonary Disease

## 2014-08-29 VITALS — BP 122/68 | HR 80 | Temp 97.6°F | Wt 154.0 lb

## 2014-08-29 DIAGNOSIS — J4520 Mild intermittent asthma, uncomplicated: Secondary | ICD-10-CM

## 2014-08-29 DIAGNOSIS — Z2239 Carrier of other specified bacterial diseases: Secondary | ICD-10-CM

## 2014-08-29 DIAGNOSIS — J9819 Other pulmonary collapse: Secondary | ICD-10-CM

## 2014-08-29 MED ORDER — ALBUTEROL SULFATE 108 (90 BASE) MCG/ACT IN AEPB: 2.0000 | INHALATION_SPRAY | Freq: Four times a day (QID) | RESPIRATORY_TRACT | Status: DC | PRN

## 2014-08-29 NOTE — Assessment & Plan Note (Signed)
The patient feels that she is doing well from a breathing standpoint despite not being on inhaled corticosteroids. She rarely uses her rescue inhaler, and is satisfied with her exertional tolerance. She is having a little more cough, and we'll therefore check a chest x-ray today in light of her MAC colonization. From her description however, I suspect this is coming from her upper airway and related to postnasal drip. She will try an antihistamine for a few weeks and let us know if the cough persists.

## 2014-08-29 NOTE — Patient Instructions (Signed)
Will check chest xray today, and call you with results.  Will give you a prescription for a rescue inhaler Try taking zyrtec 10mg  OTC each night for a few weeks to see if cough improves.  If does not, let me know  followup with me again in one year.

## 2014-08-29 NOTE — Progress Notes (Signed)
   Subjective:    Patient ID: Sophia Patterson, female    DOB: Apr 14, 1941, 74 y.o.   MRN: [id number]  HPI Patient comes in today for follow-up of her known bronchiectasis with right middle lobe syndrome and MAC colonization. She also has a history of mild asthma, and has done well off inhaled corticosteroids. She rarely uses her rescue inhaler, and is satisfied currently with her exertional tolerance. Her only complaint today is that of a mild increase in her cough, and she believes this is coming from postnasal drip into the back of her throat. She has not seen purulent secretions, nor does she have chest congestion.   Review of Systems  Constitutional: Negative for fever and unexpected weight change.  HENT: Negative for congestion, dental problem, ear pain, nosebleeds, postnasal drip, rhinorrhea, sinus pressure, sneezing, sore throat and trouble swallowing.   Eyes: Negative for redness and itching.  Respiratory: Positive for cough. Negative for chest tightness, shortness of breath and wheezing.   Cardiovascular: Negative for palpitations and leg swelling.  Gastrointestinal: Negative for nausea and vomiting.  Genitourinary: Negative for dysuria.  Musculoskeletal: Negative for joint swelling.  Skin: Negative for rash.  Neurological: Negative for headaches.  Hematological: Does not bruise/bleed easily.  Psychiatric/Behavioral: Negative for dysphoric mood. The patient is not nervous/anxious.        Objective:   Physical Exam Well-developed female in no acute distress Nose without purulence or discharge noted Neck without lymphadenopathy or thyromegaly Chest totally clear to auscultation, no wheezing Cardiac exam with regular rate and rhythm Lower extremities with no edema, no cyanosis Alert and oriented, moves all 4 extremities.       Assessment & Plan:

## 2014-09-03 ENCOUNTER — Telehealth: Payer: Self-pay | Admitting: Pulmonary Disease

## 2014-09-03 NOTE — Telephone Encounter (Signed)
Notes Recorded by Barbaraann ShareKeith M Clance, MD on 08/30/2014 at 4:48 PM Let pt know that her cxr is completely stable.  --  Pt is aware of results. Nothing further was needed.

## 2015-08-28 ENCOUNTER — Ambulatory Visit (INDEPENDENT_AMBULATORY_CARE_PROVIDER_SITE_OTHER): Payer: Medicare Other | Admitting: Emergency Medicine

## 2015-08-28 ENCOUNTER — Encounter: Payer: Self-pay | Admitting: Emergency Medicine

## 2015-08-28 VITALS — BP 120/70 | HR 97 | Ht 68.0 in | Wt 157.0 lb

## 2015-08-28 DIAGNOSIS — J479 Bronchiectasis, uncomplicated: Secondary | ICD-10-CM | POA: Diagnosis not present

## 2015-08-28 MED ORDER — ALBUTEROL SULFATE 108 (90 BASE) MCG/ACT IN AEPB: 2.0000 | INHALATION_SPRAY | Freq: Four times a day (QID) | RESPIRATORY_TRACT | Status: DC | PRN

## 2015-08-28 NOTE — Patient Instructions (Signed)
Please continue to use your albuterol 2 puffs up as needed for shortness of breath.  We will repeat your CT scan of the chest before your visit next year for [place] to review.  Follow with Dr Delton Coombes in 12 months or sooner if you have any problems. We will review your Ct scan at that time.

## 2015-08-28 NOTE — Progress Notes (Signed)
Subjective:    Patient ID: Sophia Patterson, female    DOB: 07/11/1941, 75 y.o.   MRN: [id number]  HPI 75 yo woman, hx of bronchiectasis and MAIC colonization without being treated. Also fixed asthma based on spirometry from 2006.  Uses SABA a few times a year. No scheduled BD's. She has occasional exacerbations - last time was Fall '16, treated w abx by her PCP. She is overall doing well. No significant cough currently.     Review of Systems As per HPI  Past Medical History  Diagnosis Date  . Pulmonary nodule   . Migraine   . Asthma 2005  . Cardiac arrhythmia 1995  . Colon polyps 02/02/2012  . Diverticulosis 02/02/2012  . HLD (hyperlipidemia) 2005  . Hypothyroidism 1995  . IBS (irritable bowel syndrome) 02/02/2012  . Pneumonia 2001     Family History  Problem Relation Age of Onset  . Kidney cancer Father   . Heart disease Mother   . Heart disease Paternal Grandmother   . Heart disease Maternal Grandmother   . Heart disease Maternal Grandfather   . Heart disease Sister   . Heart disease Brother   . Irritable bowel syndrome Brother   . Alzheimer's disease Mother   . Stroke Father      Social History   Social History  . Marital Status: Widowed    Spouse Name: N/A  . Number of Children: 1  . Years of Education: N/A   Occupational History  . retired    Social History Main Topics  . Smoking status: Never Smoker   . Smokeless tobacco: Never Used  . Alcohol Use: Yes     Comment: 1 glass of red wine every day  . Drug Use: No  . Sexual Activity: Not on file   Other Topics Concern  . Not on file   Social History Narrative     Allergies  Allergen Reactions  . Amoxicillin Swelling  . Morphine And Related     Effects breathing     Outpatient Prescriptions Prior to Visit  Medication Sig Dispense Refill  . cholecalciferol (VITAMIN D) 1000 UNITS tablet Take 1,000 Units by mouth daily.      . cyclobenzaprine (FLEXERIL) 10 MG tablet as needed.     .  dicyclomine (BENTYL) 10 MG capsule Take 10 mg by mouth 4 (four) times daily -  before meals and at bedtime.    Marland Kitchen EPIPEN 2-PAK 0.3 MG/0.3ML DEVI     . levothyroxine (SYNTHROID, LEVOTHROID) 75 MCG tablet Take 75 mcg by mouth daily.      . Albuterol Sulfate (PROAIR RESPICLICK) 108 (90 BASE) MCG/ACT AEPB Inhale 2 puffs into the lungs every 6 (six) hours as needed. 1 each 0   No facility-administered medications prior to visit.         Objective:   Physical Exam Filed Vitals:   08/28/15 1438  BP: 120/70  Pulse: 97  Height:  (1.727 m)  Weight: 157 lb (71.215 kg)  SpO2: 98%   Gen: Pleasant, well-nourished, in no distress,  normal affect  ENT: No lesions,  mouth clear,  oropharynx clear, no postnasal drip  Neck: No JVD, no TMG, no carotid bruits  Lungs: No use of accessory muscles, no dullness to percussion, clear without rales or rhonchi  Cardiovascular: RRR, heart sounds normal, no murmur or gallops, no peripheral edema  Abdomen: soft and NT, no HSM,  BS normal  Musculoskeletal: No deformities, no cyanosis or clubbing  Neuro:  alert, non focal  Skin: Warm, no lesions or rashes      Assessment & Plan:  Mycobacterium avium complex colonization We will plan to repeat her CT scan of the chest next year before her usual follow-up visit. February 2018. High-resolution CT without contrast. If she changes clinically before that time we will move the CT sooner  Asthma Continue albuterol when necessary. She has been using the Pro-air respiclick

## 2015-08-28 NOTE — Assessment & Plan Note (Signed)
We will plan to repeat her CT scan of the chest next year before her usual follow-up visit. February 2018. High-resolution CT without contrast. If she changes clinically before that time we will move the CT sooner

## 2015-08-28 NOTE — Assessment & Plan Note (Signed)
Continue albuterol when necessary. She has been using the Pro-air respiclick

## 2015-09-01 ENCOUNTER — Ambulatory Visit: Payer: Medicare Other | Admitting: Pulmonary Disease

## 2016-04-29 ENCOUNTER — Telehealth: Payer: Self-pay | Admitting: Emergency Medicine

## 2016-04-29 NOTE — Telephone Encounter (Signed)
Patient calling to get CT scheduled prior to her 09/02/16 appointment with Dr. Delton CoombesByrum, patient wants to have scheduled same week as appointment [place].   [facility] to [facility] for scheduling - order has already been placed.

## 2016-05-03 NOTE — Telephone Encounter (Signed)
I scheduled pt for CT in February prior to pt's appt with Dr Delton CoombesByrum.  Called pt & gave her appt info.  Nothing further needed.

## 2016-08-30 ENCOUNTER — Ambulatory Visit (INDEPENDENT_AMBULATORY_CARE_PROVIDER_SITE_OTHER)
Admission: RE | Admit: 2016-08-30 | Discharge: 2016-08-30 | Disposition: A | Discharge status: Home / Self Care | Payer: Medicare Other | Source: Ambulatory Visit | Attending: Emergency Medicine | Admitting: Emergency Medicine

## 2016-08-30 DIAGNOSIS — J479 Bronchiectasis, uncomplicated: Secondary | ICD-10-CM

## 2016-09-02 ENCOUNTER — Ambulatory Visit (INDEPENDENT_AMBULATORY_CARE_PROVIDER_SITE_OTHER): Payer: Medicare Other | Admitting: Emergency Medicine

## 2016-09-02 ENCOUNTER — Encounter: Payer: Self-pay | Admitting: Emergency Medicine

## 2016-09-02 DIAGNOSIS — J309 Allergic rhinitis, unspecified: Secondary | ICD-10-CM | POA: Insufficient documentation

## 2016-09-02 DIAGNOSIS — Z2239 Carrier of other specified bacterial diseases: Secondary | ICD-10-CM

## 2016-09-02 DIAGNOSIS — J4520 Mild intermittent asthma, uncomplicated: Secondary | ICD-10-CM | POA: Diagnosis not present

## 2016-09-02 DIAGNOSIS — J301 Allergic rhinitis due to pollen: Secondary | ICD-10-CM

## 2016-09-02 MED ORDER — ALBUTEROL SULFATE 108 (90 BASE) MCG/ACT IN AEPB: 2.0000 | INHALATION_SPRAY | Freq: Four times a day (QID) | RESPIRATORY_TRACT | 2 refills | Status: DC | PRN

## 2016-09-02 NOTE — Patient Instructions (Signed)
Keep your albuterol available to use 2 puffs if needed for shortness of breath.  Your CT scan is stable compared with 2014.  Follow with Dr Delton CoombesByrum in 12 months or sooner if you have any problems

## 2016-09-02 NOTE — Assessment & Plan Note (Signed)
Minimal sx. Will refill her albuterol to use prn.

## 2016-09-02 NOTE — Assessment & Plan Note (Signed)
Doing well at this time. Her CT scan of the chest showed stable bronchiectasis without any evidence for progression. She has not been treated and we do not need to do so now. Continue to follow.

## 2016-09-02 NOTE — Progress Notes (Signed)
Subjective:    Patient ID: Sophia Patterson, female    DOB: 23-Sep-1940, 76 y.o.   MRN: [id number]  HPI 76 yo woman, hx of bronchiectasis and MAIC colonization without being treated. Also fixed asthma based on spirometry from 2006.  Uses SABA a few times a year. No scheduled BD's. She has occasional exacerbations - last time was Fall '16, treated w abx by her PCP. She is overall doing well. No significant cough currently.   ROV 09/02/16 -- Patient has a history of bronchiectasis and mycobacterial colonization, asthma based on spirometry 2006. We repeated a CT chest on 08/30/16 that I have reviewed. This shows continued evidence for mild bronchiectasis and patchy tree-and-bud opacities consistent with MAIC. She has very little mucous. Daily dry cough. She does have nasal congestion, especially in the am. She used to be on allegra. She has n't needed her saba at all.    Review of Systems As per HPI  Past Medical History:  Diagnosis Date  . Asthma 2005  . Cardiac arrhythmia 1995  . Colon polyps 02/02/2012  . Diverticulosis 02/02/2012  . HLD (hyperlipidemia) 2005  . Hypothyroidism 1995  . IBS (irritable bowel syndrome) 02/02/2012  . Migraine   . Pneumonia 2001  . Pulmonary nodule      Family History  Problem Relation Age of Onset  . Kidney cancer Father   . Heart disease Mother   . Heart disease Paternal Grandmother   . Heart disease Maternal Grandmother   . Heart disease Maternal Grandfather   . Heart disease Sister   . Heart disease Brother   . Irritable bowel syndrome Brother   . Alzheimer's disease Mother   . Stroke Father      Social History   Social History  . Marital status: Widowed    Spouse name: N/A  . Number of children: 1  . Years of education: N/A   Occupational History  . retired Retired   Social History Main Topics  . Smoking status: Never Smoker  . Smokeless tobacco: Never Used  . Alcohol use Yes     Comment: 1 glass of red wine every day  . Drug  use: No  . Sexual activity: Not on file   Other Topics Concern  . Not on file   Social History Narrative  . No narrative on file     Allergies  Allergen Reactions  . Amoxicillin Swelling  . Bee Venom   . Morphine And Related     Effects breathing  . Sulfa Antibiotics      Outpatient Medications Prior to Visit  Medication Sig Dispense Refill  . Albuterol Sulfate (PROAIR RESPICLICK) 165 (90 Base) MCG/ACT AEPB Inhale 2 puffs into the lungs every 6 (six) hours as needed. 1 each 2  . cholecalciferol (VITAMIN D) 1000 UNITS tablet Take 1,000 Units by mouth daily.      . cyclobenzaprine (FLEXERIL) 10 MG tablet as needed.     . dicyclomine (BENTYL) 10 MG capsule Take 10 mg by mouth 4 (four) times daily -  before meals and at bedtime.    Marland Kitchen EPIPEN 2-PAK 0.3 MG/0.3ML DEVI     . levothyroxine (SYNTHROID, LEVOTHROID) 75 MCG tablet Take 75 mcg by mouth daily.       No facility-administered medications prior to visit.          Objective:   Physical Exam Vitals:   09/02/16 1317  BP: 120/72  Pulse: 84  SpO2: 95%  Weight: 155 lb 6.4  oz (70.5 kg)  Height: _0  (1.727 m)   Gen: Pleasant, well-nourished, in no distress,  normal affect  ENT: No lesions,  mouth clear,  oropharynx clear, no postnasal drip  Neck: No JVD, no TMG, no carotid bruits  Lungs: No use of accessory muscles, no dullness to percussion, clear without rales or rhonchi  Cardiovascular: RRR, heart sounds normal, no murmur or gallops, no peripheral edema  Musculoskeletal: No deformities, no cyanosis or clubbing  Neuro: alert, non focal  Skin: Warm, no lesions or rashes      Assessment & Plan:  Mycobacterium avium complex colonization Doing well at this time. Her CT scan of the chest showed stable bronchiectasis without any evidence for progression. She has not been treated and we do not need to do so now. Continue to follow.   Asthma Minimal sx. Will refill her albuterol to use prn.   Allergic  rhinitis Asked her to restart her allegra, see if this helps congestion and dry cough,   Baltazar Apo, MD, PhD 09/02/2016, 1:51 PM [facility] [phone] or if no answer [phone]

## 2016-09-02 NOTE — Assessment & Plan Note (Signed)
Asked her to restart her allegra, see if this helps congestion and dry cough,

## 2016-10-04 ENCOUNTER — Telehealth: Payer: Self-pay | Admitting: Emergency Medicine

## 2016-10-04 NOTE — Telephone Encounter (Signed)
Spoke with pt's sister, states that proair respiclik is requiring a PA.   Called pharmacy, states that Ventolin is covered.  This has already been called in. Pt's sister aware.  Nothing further needed.

## 2016-10-04 NOTE — Telephone Encounter (Signed)
Patient's sister is calling back [phone]. States patient has bronchitis and states her old albuterol is just about gone.

## 2016-10-04 NOTE — Telephone Encounter (Signed)
lmtcb x1 for pt. 

## 2016-10-14 ENCOUNTER — Telehealth: Payer: Self-pay | Admitting: Emergency Medicine

## 2016-10-14 NOTE — Telephone Encounter (Signed)
Called and spoke to pt's sister, Corrie DandyMary. Corrie DandyMary states the pt told her she is more SOB. I asked to speak with pt, Corrie DandyMary states the pt is at home. Called pt and had to leave a VM.

## 2016-10-18 NOTE — Telephone Encounter (Signed)
Called and spoke to pt. Pt states she already called back this morning and made and appt for 4/5, pt ok waiting until then for an appt. Advised pt that if her s/s were to worsen at all then to call us back. Pt verbalized understanding and denied any further questions or concerns at this time.

## 2016-10-19 ENCOUNTER — Ambulatory Visit: Payer: Medicare Other | Admitting: Pulmonary Disease

## 2016-10-21 ENCOUNTER — Ambulatory Visit: Payer: Medicare Other | Admitting: Pulmonary Disease

## 2017-09-08 ENCOUNTER — Ambulatory Visit: Payer: Medicare Other | Admitting: Emergency Medicine

## 2017-09-15 ENCOUNTER — Ambulatory Visit: Payer: Medicare Other | Admitting: Emergency Medicine

## 2017-10-04 ENCOUNTER — Encounter: Payer: Self-pay | Admitting: Emergency Medicine

## 2017-10-04 ENCOUNTER — Ambulatory Visit (INDEPENDENT_AMBULATORY_CARE_PROVIDER_SITE_OTHER): Payer: Medicare Other | Admitting: Emergency Medicine

## 2017-10-04 DIAGNOSIS — J9819 Other pulmonary collapse: Secondary | ICD-10-CM

## 2017-10-04 NOTE — Patient Instructions (Signed)
Please keep your albuterol available to use 2 puffs as needed for shortness of breath, wheezing, chest tightness.  Call our office for any changes in your cough frequency, mucous production or shortness of breath. We do not need to repeat your CT scan of the chest at this time.  Follow with Dr Delton CoombesByrum in 12 months or sooner if you have any problems

## 2017-10-04 NOTE — Progress Notes (Signed)
Subjective:    Patient ID: Sophia Patterson, female    DOB: 03-24-1941, 77 y.o.   MRN: [id number]  HPI  ROV 10/04/17 --this is a follow-up visit for a pleasant 77 year old woman with a history of bronchiectasis and Mycobacterium avium colonization.  She has [facility]  She also has fixed asthma based on spirometry. Her last CT chest was 08/2016 as above. She reports that she had a flare of her bronchiectasis about a year ago that required prednisone, abx, temporarily on ICS. She improved. Her baseline is to have a dry cough daily. She does not have a mucous burden typically. Never uses her albuterol.    Review of Systems As per HPI  Past Medical History:  Diagnosis Date  . Asthma 2005  . Cardiac arrhythmia 1995  . Colon polyps 02/02/2012  . Diverticulosis 02/02/2012  . HLD (hyperlipidemia) 2005  . Hypothyroidism 1995  . IBS (irritable bowel syndrome) 02/02/2012  . Migraine   . Pneumonia 2001  . Pulmonary nodule      Family History  Problem Relation Age of Onset  . Kidney cancer Father   . Heart disease Mother   . Heart disease Paternal Grandmother   . Heart disease Maternal Grandmother   . Heart disease Maternal Grandfather   . Heart disease Sister   . Heart disease Brother   . Irritable bowel syndrome Brother   . Alzheimer's disease Mother   . Stroke Father      Social History   Socioeconomic History  . Marital status: Widowed    Spouse name: Not on file  . Number of children: 1  . Years of education: Not on file  . Highest education level: Not on file  Social Needs  . Financial resource strain: Not on file  . Food insecurity - worry: Not on file  . Food insecurity - inability: Not on file  . Transportation needs - medical: Not on file  . Transportation needs - non-medical: Not on file  Occupational History  . Occupation: retired    Associate Professormployer: RETIRED  Tobacco Use  . Smoking status: Never Smoker  . Smokeless tobacco: Never Used    Substance and Sexual Activity  . Alcohol use: Yes    Comment: 1 glass of red wine every day  . Drug use: No  . Sexual activity: Not on file  Other Topics Concern  . Not on file  Social History Narrative  . Not on file     Allergies  Allergen Reactions  . Amoxicillin Swelling  . Bee Venom   . Morphine And Related     Effects breathing  . Sulfa Antibiotics      Outpatient Medications Prior to Visit  Medication Sig Dispense Refill  . Albuterol Sulfate (PROAIR RESPICLICK) 108 (90 Base) MCG/ACT AEPB Inhale 2 puffs into the lungs every 6 (six) hours as needed. 1 each 2  . cholecalciferol (VITAMIN D) 1000 UNITS tablet Take 1,000 Units by mouth daily.      . cyclobenzaprine (FLEXERIL) 10 MG tablet as needed.     . dicyclomine (BENTYL) 10 MG capsule Take 10 mg by mouth 4 (four) times daily -  before meals and at bedtime.    Marland Kitchen. EPIPEN 2-PAK 0.3 MG/0.3ML DEVI     . levothyroxine (SYNTHROID, LEVOTHROID) 75 MCG tablet Take 75 mcg by mouth daily.       No facility-administered medications prior to visit.          Objective:  Physical Exam Vitals:   10/04/17 1210  BP: 108/70  Pulse: 69  SpO2: 96%  Weight: 148 lb (67.1 kg)  Height: 5\' 7"  (1.702 m)   Gen: Pleasant, well-nourished, in no distress,  normal affect  ENT: No lesions,  mouth clear,  oropharynx clear, no postnasal drip  Neck: No JVD, no stridor  Lungs: No use of accessory muscles,clear without rales or rhonchi  Cardiovascular: RRR, heart sounds normal, no murmur or gallops, no peripheral edema  Musculoskeletal: No deformities, no cyanosis or clubbing  Neuro: alert, non focal  Skin: Warm, no lesions or rashes      Assessment & Plan:  Right middle lobe syndrome bronchiectasis without significant mucous burden. Her cough sounds like there is an UA component. She denies GERD or rhinitis.   Please keep your albuterol available to use 2 puffs as needed for shortness of breath, wheezing, chest tightness.  Call  our office for any changes in your cough frequency, mucous production or shortness of breath. We do not need to repeat your CT scan of the chest at this time.  Follow with Dr Delton Coombes in 12 months or sooner if you have any problems  Levy Pupa, MD, PhD 10/04/2017, 12:41 PM [facility] [phone] or if no answer [phone]

## 2017-10-04 NOTE — Assessment & Plan Note (Signed)
bronchiectasis without significant mucous burden. Her cough sounds like there is an UA component. She denies GERD or rhinitis.   Please keep your albuterol available to use 2 puffs as needed for shortness of breath, wheezing, chest tightness.  Call our office for any changes in your cough frequency, mucous production or shortness of breath. We do not need to repeat your CT scan of the chest at this time.  Follow with Dr Delton CoombesByrum in 12 months or sooner if you have any problems

## 2018-05-16 ENCOUNTER — Telehealth: Payer: Self-pay | Admitting: Emergency Medicine

## 2018-05-16 MED ORDER — DOXYCYCLINE HYCLATE 100 MG PO TABS: 100.0000 mg | ORAL_TABLET | Freq: Two times a day (BID) | ORAL | 0 refills | Status: DC

## 2018-05-16 NOTE — Telephone Encounter (Signed)
I sent in Rx to [facility]. I tried to call pt, she did not answer and I was not able to leave a message. Will try again in the morning to make sure she received it.

## 2018-05-16 NOTE — Telephone Encounter (Signed)
Called and spoke with patient. Patient states she saw her PCP yesterday. They told her she has bronchitis.  They gave her RX for cough medication, prednisone 40mg  daily, and nasal spray.  Patient is wanting to know if she needs an Antibiotic or a specific inhaler for her bronchitis. Patient has albuterol Proair inhaler (asking for refill will send refill).   Dr. Delton Coombes please advise.

## 2018-05-16 NOTE — Telephone Encounter (Signed)
I would recommend giving doxycycline 100mg  bid x 7 days.  She needs to call to let us know how she is doing after taking

## 2018-05-17 NOTE — Telephone Encounter (Signed)
Atc call patient no voice mail at this time will call back.

## 2018-05-17 NOTE — Telephone Encounter (Signed)
Called patient unable to reach left message to give [place] a call back. Per protocol will close encounter.

## 2018-05-17 NOTE — Telephone Encounter (Signed)
ATC home- NA  ATC cell- NA and VM full

## 2018-05-17 NOTE — Telephone Encounter (Signed)
Called patient on all numbers provided, unable to reach and unable to leave voicemail. Will leave open to call back.

## 2018-05-22 ENCOUNTER — Other Ambulatory Visit: Payer: Self-pay | Admitting: *Deleted

## 2018-05-22 MED ORDER — ALBUTEROL SULFATE HFA 108 (90 BASE) MCG/ACT IN AERS: 2.0000 | INHALATION_SPRAY | RESPIRATORY_TRACT | 5 refills | Status: DC | PRN

## 2018-09-11 ENCOUNTER — Ambulatory Visit: Payer: Medicare Other | Admitting: Emergency Medicine

## 2018-09-13 ENCOUNTER — Ambulatory Visit (INDEPENDENT_AMBULATORY_CARE_PROVIDER_SITE_OTHER): Payer: Medicare Other | Admitting: Emergency Medicine

## 2018-09-13 ENCOUNTER — Encounter: Payer: Self-pay | Admitting: Emergency Medicine

## 2018-09-13 DIAGNOSIS — J471 Bronchiectasis with (acute) exacerbation: Secondary | ICD-10-CM | POA: Diagnosis not present

## 2018-09-13 DIAGNOSIS — R05 Cough: Secondary | ICD-10-CM

## 2018-09-13 DIAGNOSIS — J4520 Mild intermittent asthma, uncomplicated: Secondary | ICD-10-CM | POA: Diagnosis not present

## 2018-09-13 DIAGNOSIS — Z2239 Carrier of other specified bacterial diseases: Secondary | ICD-10-CM | POA: Diagnosis not present

## 2018-09-13 DIAGNOSIS — J479 Bronchiectasis, uncomplicated: Secondary | ICD-10-CM

## 2018-09-13 DIAGNOSIS — R053 Chronic cough: Secondary | ICD-10-CM

## 2018-09-13 NOTE — Assessment & Plan Note (Signed)
Never treated.  I think we can continue to follow as long as her clinical status and CT scan remained stable.

## 2018-09-13 NOTE — Assessment & Plan Note (Signed)
No significant sputum production.  She did have an acute exacerbation of cough (again a dry cough) in October, now back to baseline.  Do not believe she needs secretion clearance routine right now.  I do believe she needs a repeat CT scan of the chest to ensure no evidence of progression especially since she is colonized with [facility]

## 2018-09-13 NOTE — Assessment & Plan Note (Signed)
She has a chronic dry cough that is atypical for it to be related to her bronchiectasis.  She had a work-up that suggested esophageal reflux.  I question whether she may be having silent reflux that is sustaining her cough.  She is going to try taking her pantoprazole daily for a month to see if this helps

## 2018-09-13 NOTE — Addendum Note (Signed)
Addended by: Jaynee Eagles C on: 09/13/2018 01:57 PM   Modules accepted: Orders

## 2018-09-13 NOTE — Assessment & Plan Note (Signed)
Continue to keep albuterol available to use as needed

## 2018-09-13 NOTE — Patient Instructions (Signed)
Please keep your albuterol available to use 2 puffs if you need it for shortness of breath, chest tightness, wheezing. We will repeat your CT scan of the chest without contrast to compare with your prior.  We will try to do this in [place] but if unable to do so this week then we will arrange in [place]. Please try starting your pantoprazole 40 mg once daily for the next month to see if this helps decrease the frequency of your dry cough.  If it is helpful then you can continue it daily.  Take this medication 1 hour around food. Follow with Dr. Delton Coombes in 12 months or sooner if you have any problems.

## 2018-09-13 NOTE — Progress Notes (Signed)
Subjective:    Patient ID: Sophia Patterson, female    DOB: 1940/09/18, 78 y.o.   MRN: [id number]  HPI  ROV 10/04/17 --this is a follow-up visit for a pleasant 78 year old woman with a history of bronchiectasis and Mycobacterium avium colonization.  She has not been treated for her [facility].  She also has fixed asthma based on spirometry. Her last CT chest was 08/2016 as above. She reports that she had a flare of her bronchiectasis about a year ago that required prednisone, abx, temporarily on ICS. She improved. Her baseline is to have a dry cough daily. She does not have a mucous burden typically. Never uses her albuterol.   ROV 09/13/2018 --Sophia Patterson is 78, a never smoker, whom we follow for fixed asthma and bronchiectasis.  She has a history of Mycobacterium avium colonization (never treated).  She was treated for an acute flare of her bronchiectasis with prednisone and doxycycline in October 2019.  She keeps a dry cough but does not have any significant mucus production, happens daily. Can be worst when she is supine. She had an episode of CP waking her from sleep, had a reassuring cards eval, was given PPI but she has never started it.  She has albuterol available to use if needed, uses it w exertion and when she is flaring.  Her last CT scan of the chest was 08/30/2016  Review of Systems As per HPI  Past Medical History:  Diagnosis Date  . Asthma 2005  . Cardiac arrhythmia 1995  . Colon polyps 02/02/2012  . Diverticulosis 02/02/2012  . HLD (hyperlipidemia) 2005  . Hypothyroidism 1995  . IBS (irritable bowel syndrome) 02/02/2012  . Migraine   . Pneumonia 2001  . Pulmonary nodule      Family History  Problem Relation Age of Onset  . Kidney cancer Father   . Heart disease Mother   . Heart disease Paternal Grandmother   . Heart disease Maternal Grandmother   . Heart disease Maternal Grandfather   . Heart disease Sister   . Heart disease Brother   . Irritable bowel syndrome Brother    . Alzheimer's disease Mother   . Stroke Father      Social History   Socioeconomic History  . Marital status: Widowed    Spouse name: Not on file  . Number of children: 1  . Years of education: Not on file  . Highest education level: Not on file  Occupational History  . Occupation: retired    Fish farm manager: RETIRED  Social Needs  . Financial resource strain: Not on file  . Food insecurity:    Worry: Not on file    Inability: Not on file  . Transportation needs:    Medical: Not on file    Non-medical: Not on file  Tobacco Use  . Smoking status: Never Smoker  . Smokeless tobacco: Never Used  Substance and Sexual Activity  . Alcohol use: Yes    Comment: 1 glass of red wine every day  . Drug use: No  . Sexual activity: Not on file  Lifestyle  . Physical activity:    Days per week: Not on file    Minutes per session: Not on file  . Stress: Not on file  Relationships  . Social connections:    Talks on phone: Not on file    Gets together: Not on file    Attends religious service: Not on file    Active member of club or organization: Not  on file    Attends meetings of clubs or organizations: Not on file    Relationship status: Not on file  . Intimate partner violence:    Fear of current or ex partner: Not on file    Emotionally abused: Not on file    Physically abused: Not on file    Forced sexual activity: Not on file  Other Topics Concern  . Not on file  Social History Narrative  . Not on file     Allergies  Allergen Reactions  . Amoxicillin Swelling  . Bee Venom   . Morphine And Related     Effects breathing  . Sulfa Antibiotics      Outpatient Medications Prior to Visit  Medication Sig Dispense Refill  . albuterol (PROVENTIL HFA;VENTOLIN HFA) 108 (90 Base) MCG/ACT inhaler Inhale 2 puffs into the lungs every 4 (four) hours as needed for wheezing or shortness of breath. 1 Inhaler 5  . cholecalciferol (VITAMIN D) 1000 UNITS tablet Take 1,000 Units by mouth  daily.      . cyclobenzaprine (FLEXERIL) 10 MG tablet as needed.     . dicyclomine (BENTYL) 10 MG capsule Take 10 mg by mouth 4 (four) times daily -  before meals and at bedtime.    Marland Kitchen EPIPEN 2-PAK 0.3 MG/0.3ML DEVI     . levothyroxine (SYNTHROID, LEVOTHROID) 75 MCG tablet Take 75 mcg by mouth daily.      Marland Kitchen doxycycline (VIBRA-TABS) 100 MG tablet Take 1 tablet (100 mg total) by mouth 2 (two) times daily. 14 tablet 0   No facility-administered medications prior to visit.          Objective:   Physical Exam Vitals:   09/13/18 1337  BP: 110/70  Pulse: 80  SpO2: 95%  Weight: 149 lb (67.6 kg)  Height: _0  (1.702 m)   Gen: Pleasant, well-nourished, in no distress,  normal affect  ENT: No lesions,  mouth clear,  oropharynx clear, no postnasal drip  Neck: No JVD, no stridor  Lungs: No use of accessory muscles, clear, no wheeze or crackles  Cardiovascular: RRR, heart sounds normal, no murmur or gallops, no peripheral edema  Musculoskeletal: No deformities, no cyanosis or clubbing  Neuro: alert, non focal  Skin: Warm, no lesions or rashes      Assessment & Plan:  Bronchiectasis without acute exacerbation (HCC) No significant sputum production.  She did have an acute exacerbation of cough (again a dry cough) in October, now back to baseline.  Do not believe she needs secretion clearance routine right now.  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  I think we can continue to follow as long as her clinical status and CT scan remained stable.  Chronic cough She has a chronic dry cough that is atypical for it to be related to her bronchiectasis.  She had a work-up that suggested esophageal reflux.  I question whether she may be having silent reflux that is sustaining her  cough.  She is going to try taking her pantoprazole daily for a month to see if this helps   Baltazar Apo, MD, PhD 09/13/2018, 1:55 PM [facility] [phone] or if no answer [phone]

## 2018-09-15 ENCOUNTER — Ambulatory Visit (INDEPENDENT_AMBULATORY_CARE_PROVIDER_SITE_OTHER)
Admission: RE | Admit: 2018-09-15 | Discharge: 2018-09-15 | Disposition: A | Discharge status: Home / Self Care | Payer: Medicare Other | Source: Ambulatory Visit | Attending: Emergency Medicine | Admitting: Emergency Medicine

## 2018-09-15 DIAGNOSIS — J471 Bronchiectasis with (acute) exacerbation: Secondary | ICD-10-CM

## 2018-09-21 ENCOUNTER — Telehealth: Payer: Self-pay | Admitting: Emergency Medicine

## 2018-09-21 NOTE — Telephone Encounter (Signed)
Advised pt of results. Pt understood and nothing further is needed.    Notes recorded by Leslye Peer, MD on 09/19/2018 at 11:44 AM EST Please inform patient that CT chest shows stable changes consistent with her known mycobacterial colonization. No significant progression. This is good news. . Thanks

## 2018-10-06 ENCOUNTER — Ambulatory Visit: Payer: Medicare Other | Admitting: Emergency Medicine

## 2019-08-21 ENCOUNTER — Ambulatory Visit: Payer: Medicare Other | Admitting: Emergency Medicine

## 2019-08-21 ENCOUNTER — Ambulatory Visit: Payer: Medicare Other | Admitting: Primary Care

## 2019-09-17 ENCOUNTER — Encounter: Payer: Self-pay | Admitting: Emergency Medicine

## 2019-09-17 ENCOUNTER — Ambulatory Visit (INDEPENDENT_AMBULATORY_CARE_PROVIDER_SITE_OTHER): Payer: Medicare Other | Admitting: Emergency Medicine

## 2019-09-17 ENCOUNTER — Other Ambulatory Visit: Payer: Self-pay

## 2019-09-17 DIAGNOSIS — J4520 Mild intermittent asthma, uncomplicated: Secondary | ICD-10-CM

## 2019-09-17 DIAGNOSIS — Z2239 Carrier of other specified bacterial diseases: Secondary | ICD-10-CM

## 2019-09-17 DIAGNOSIS — J479 Bronchiectasis, uncomplicated: Secondary | ICD-10-CM | POA: Diagnosis not present

## 2019-09-17 DIAGNOSIS — R05 Cough: Secondary | ICD-10-CM | POA: Diagnosis not present

## 2019-09-17 DIAGNOSIS — R053 Chronic cough: Secondary | ICD-10-CM

## 2019-09-17 NOTE — Progress Notes (Signed)
Subjective:    Patient ID: Sophia Patterson, female    DOB: [birth year], 79 y.o.   MRN: [id number]  HPI  ROV 09/13/2018 --Sophia Patterson is 42, a never smoker, whom we follow for fixed asthma and bronchiectasis.  She has a history of Mycobacterium avium colonization (never treated).  She was treated for an acute flare of her bronchiectasis with prednisone and doxycycline in October 2019.  She keeps a dry cough but does not have any significant mucus production, happens daily. Can be worst when she is supine. She had an episode of CP waking her from sleep, had a reassuring cards eval, was given PPI but she has never started it.  She has albuterol available to use if needed, uses it w exertion and when she is flaring.  Her last CT scan of the chest was 08/30/2016  ROV 09/17/2019 --Sophia Patterson is 87 and has a history of fixed asthma with bronchiectasis, Mycobacterium avium colonization (never treated).  She has chronic cough, usually dry has a history of reflux it may be a contributor.  At our last visit I recommended that she start pantoprazole to see if she get benefit >> didn't change things very much. Overall her cough is better even off the PPI.  A CT scan of her chest from 09/15/2018 showed stable mild to moderate bronchiolitis consistent with stable MAIC, scattered mucoid impaction small left lower lobe nodular opacities, largest 4 mm. She uses her albuterol about 1x a week. Often associated with extreme temps, exertion. She loves to garden and yardwork - no longer able to do it in the hot weather. She had a flare in November 2020, associated w URI sx, was not checked for COVID. Was treated with prednisone. No other flares.    Review of Systems As per HPI  Past Medical History:  Diagnosis Date  . Asthma 2005  . Cardiac arrhythmia 1995  . Colon polyps 02/02/2012  . Diverticulosis 02/02/2012  . HLD (hyperlipidemia) 2005  . Hypothyroidism 1995  . IBS (irritable bowel syndrome) 02/02/2012  .  Migraine   . Pneumonia 2001  . Pulmonary nodule      Family History  Problem Relation Age of Onset  . Kidney cancer Father   . Stroke Father   . Heart disease Mother   . Alzheimer's disease Mother   . Heart disease Paternal Grandmother   . Heart disease Maternal Grandmother   . Heart disease Maternal Grandfather   . Heart disease Sister   . Heart disease Brother   . Irritable bowel syndrome Brother      Social History   Socioeconomic History  . Marital status: Widowed    Spouse name: Not on file  . Number of children: 1  . Years of education: Not on file  . Highest education level: Not on file  Occupational History  . Occupation: retired    Fish farm manager: RETIRED  Tobacco Use  . Smoking status: Never Smoker  . Smokeless tobacco: Never Used  Substance and Sexual Activity  . Alcohol use: Yes    Comment: 1 glass of red wine every day  . Drug use: No  . Sexual activity: Not on file  Other Topics Concern  . Not on file  Social History Narrative  . Not on file   Social Determinants of Health   Financial Resource Strain:   . Difficulty of Paying Living Expenses: Not on file  Food Insecurity:   . Worried About Charity fundraiser in the Last  Year: Not on file  . Ran Out of Food in the Last Year: Not on file  Transportation Needs:   . Lack of Transportation (Medical): Not on file  . Lack of Transportation (Non-Medical): Not on file  Physical Activity:   . Days of Exercise per Week: Not on file  . Minutes of Exercise per Session: Not on file  Stress:   . Feeling of Stress : Not on file  Social Connections:   . Frequency of Communication with Friends and Family: Not on file  . Frequency of Social Gatherings with Friends and Family: Not on file  . Attends Religious Services: Not on file  . Active Member of Clubs or Organizations: Not on file  . Attends Archivist Meetings: Not on file  . Marital Status: Not on file  Intimate Partner Violence:   . Fear of  Current or Ex-Partner: Not on file  . Emotionally Abused: Not on file  . Physically Abused: Not on file  . Sexually Abused: Not on file     Allergies  Allergen Reactions  . Amoxicillin Swelling  . Bee Venom   . Morphine And Related     Effects breathing  . Sulfa Antibiotics      Outpatient Medications Prior to Visit  Medication Sig Dispense Refill  . albuterol (PROVENTIL HFA;VENTOLIN HFA) 108 (90 Base) MCG/ACT inhaler Inhale 2 puffs into the lungs every 4 (four) hours as needed for wheezing or shortness of breath. 1 Inhaler 5  . cholecalciferol (VITAMIN D) 1000 UNITS tablet Take 1,000 Units by mouth daily.      . cyclobenzaprine (FLEXERIL) 10 MG tablet as needed.     . dicyclomine (BENTYL) 10 MG capsule Take 10 mg by mouth 4 (four) times daily -  before meals and at bedtime.    Marland Kitchen EPIPEN 2-PAK 0.3 MG/0.3ML DEVI     . levothyroxine (SYNTHROID, LEVOTHROID) 75 MCG tablet Take 75 mcg by mouth daily.       No facility-administered medications prior to visit.         Objective:   Physical Exam Vitals:   09/17/19 0935  BP: 122/68  Pulse: 97  Temp: (!) 97.3 F (36.3 C)  TempSrc: Temporal  SpO2: 97%  Weight: 147 lb 9.6 oz (67 kg)  Height: _0  (1.702 m)   Gen: Pleasant, well-nourished, thin elderly woman, in no distress,  normal affect  ENT: No lesions,  mouth clear,  oropharynx clear, no postnasal drip  Neck: No JVD, no stridor  Lungs: No use of accessory muscles, clear with exception of a few mild inspiratory crackles at left base  Cardiovascular: RRR, heart sounds normal, no murmur or gallops, no peripheral edema  Musculoskeletal: Dupuytren's contracture left hand, no cyanosis or clubbing  Neuro: alert, non focal  Skin: Warm, no lesions or rashes      Assessment & Plan:  Chronic cough She deals with a chronic dry cough, actually better compared with last time.  She tried adding a PPI without much impact.  Not on this currently.  I think we can continue to  follow without any new interventions given the improvement  Asthma Continue her albuterol as needed.  She uses rarely.  She is averaging about 1 flare annually.  No indication to start an ICS at this time.  She is had difficulty tolerating in the past due to thrush.  Bronchiectasis without acute exacerbation ([place]) Stable CT scan 1 year ago.  We will plan to repeat a chest  x-ray in 6 months, repeat her CT in 1 year unless changes clinically.  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  She has remained stable, CT chest and symptomatic bronchiectasis have not evolved.  Continue surveillance.  Baltazar Apo, MD, PhD 09/17/2019, 10:05 AM [facility] [phone] or if no answer [phone]

## 2019-09-17 NOTE — Assessment & Plan Note (Signed)
Stable CT scan 1 year ago.  We will plan to repeat a chest x-ray in 6 months, repeat her CT in 1 year unless changes clinically.

## 2019-09-17 NOTE — Addendum Note (Signed)
Addended by: Vianne Bulls R on: 09/17/2019 10:15 AM   Modules accepted: Orders

## 2019-09-17 NOTE — Assessment & Plan Note (Signed)
Continue her albuterol as needed.  She uses rarely.  She is averaging about 1 flare annually.  No indication to start an ICS at this time.  She is had difficulty tolerating in the past due to thrush.

## 2019-09-17 NOTE — Assessment & Plan Note (Signed)
She deals with a chronic dry cough, actually better compared with last time.  She tried adding a PPI without much impact.  Not on this currently.  I think we can continue to follow without any new interventions given the improvement

## 2019-09-17 NOTE — Patient Instructions (Addendum)
We will plan to repeat your CT chest in 08/2020 to follow bronchiectasis Please keep your albuterol available to use 2 puffs if needed for shortness of breath, chest tightness, wheezing. Follow-up with Dr. Delton Coombes in 6 months.  We will perform a chest x-ray at that time.

## 2019-09-17 NOTE — Assessment & Plan Note (Signed)
Never treated.  She has remained stable, CT chest and symptomatic bronchiectasis have not evolved.  Continue surveillance.

## 2020-03-17 ENCOUNTER — Ambulatory Visit: Payer: Medicare Other | Admitting: Emergency Medicine

## 2020-04-02 ENCOUNTER — Other Ambulatory Visit: Payer: Self-pay | Admitting: Emergency Medicine

## 2020-04-02 DIAGNOSIS — J479 Bronchiectasis, uncomplicated: Secondary | ICD-10-CM

## 2020-04-03 ENCOUNTER — Encounter: Payer: Self-pay | Admitting: Emergency Medicine

## 2020-04-03 ENCOUNTER — Ambulatory Visit (INDEPENDENT_AMBULATORY_CARE_PROVIDER_SITE_OTHER): Payer: Medicare Other

## 2020-04-03 ENCOUNTER — Ambulatory Visit (INDEPENDENT_AMBULATORY_CARE_PROVIDER_SITE_OTHER): Payer: Medicare Other | Admitting: Emergency Medicine

## 2020-04-03 ENCOUNTER — Other Ambulatory Visit: Payer: Self-pay

## 2020-04-03 VITALS — BP 130/68 | HR 71 | Temp 97.3°F | Ht 67.0 in | Wt 149.8 lb

## 2020-04-03 DIAGNOSIS — J479 Bronchiectasis, uncomplicated: Secondary | ICD-10-CM | POA: Diagnosis not present

## 2020-04-03 DIAGNOSIS — J4520 Mild intermittent asthma, uncomplicated: Secondary | ICD-10-CM

## 2020-04-03 NOTE — Assessment & Plan Note (Signed)
Progressive dyspnea, sweating, fatigue when she is exerting, cooking and cleaning in the home.  She is usually able to tolerate this type of work.  She may get some relief from albuterol although it takes about 30 minutes.  I think she needs repeat pulmonary function testing now to assess her degree of obstruction.  Unfortunately she does not tolerate ICS very well so we will have to consider this if we decide to start maintenance therapy.

## 2020-04-03 NOTE — Patient Instructions (Addendum)
We will perform pulmonary function testing We will perform a CT scan of your chest without contrast to evaluate your bronchiectasis and Mycobacterium avium Use your albuterol 2 puffs as needed for shortness of breath, chest tightness, wheezing. You may want to consider going back on acid reflux medication at some point in the future if your dry cough persists Follow with Dr. Delton Coombes next available with full pulmonary function testing on the same day.

## 2020-04-03 NOTE — Assessment & Plan Note (Signed)
Bronchiectasis with MAIC.  She is having sweating with exertion, no night sweats, no fevers.  No purulent mucus.  I do not think this is an exacerbation of bronchiectasis.  I think we need to repeat her CT chest to assess her degree of micronodular disease, activity of MAIC, for any progression of bronchiectasis.  She agrees.  We will review next time

## 2020-04-03 NOTE — Progress Notes (Signed)
Subjective:    Patient ID: Sophia Patterson, female    DOB: April 05, 1941, 79 y.o.   MRN: [id number]  HPI   ROV 09/17/2019 --Ms. 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  She has chronic cough, usually dry has a history of reflux it may be a contributor.  At our last visit I recommended that she start pantoprazole to see if she get benefit >> didn't change things very much. Overall her cough is better even off the PPI.  A CT scan of her chest from 09/15/2018 showed stable mild to moderate bronchiolitis consistent with stable MAIC, scattered mucoid impaction small left lower lobe nodular opacities, largest 4 mm. She uses her albuterol about 1x a week. Often associated with extreme temps, exertion. She loves to garden and yardwork - no longer able to do it in the hot weather. She had a flare in November 2020, associated w URI sx, was not checked for COVID. Was treated with prednisone. No other flares.   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  She is on scheduled bronchodilator therapy, uses albuterol as needed.  Most recent chest CT was 09/15/2018.  We performed a chest x-ray today which I reviewed, shows chronic bronchitic changes with out any evidence of evolving interstitial disease or consolidation. She has stable dry cough. Over the last 3 weeks she has had an increase in her dyspnea, wheeze, exertional sweating and increased fatigue. No night sweats or fevers. No exertional CP. May be benefiting from albuterol.   She has cardiac stress test in 2019 >> normal.     Review of Systems As per HPI      Objective:   Physical Exam Vitals:   04/03/20 1653  BP: 130/68  Pulse: 71  Temp: (!) 97.3 F (36.3 C)  TempSrc: Temporal    SpO2: 96%  Weight: 149 lb 12.8 oz (67.9 kg)  Height: 5\' 7"  (1.702 m)   Gen: Pleasant, well-nourished, thin elderly woman, in no distress,  normal affect  ENT: No lesions,  mouth clear,  oropharynx clear, no postnasal drip  Neck: No JVD, no stridor  Lungs: No use of accessory muscles, clear with exception of a few mild inspiratory crackles at left base  Cardiovascular: RRR, early systolic and diastolic M, no peripheral edema  Musculoskeletal: Dupuytren's contracture left hand, no cyanosis or clubbing  Neuro: alert, non focal  Skin: Warm, no lesions or rashes      Assessment & Plan:  Asthma Progressive dyspnea, sweating, fatigue when she is exerting, cooking and cleaning in the home.  She is usually able to tolerate this type of work.  She may get some relief from albuterol although it takes about 30 minutes.  I think she needs repeat pulmonary function testing now to assess her degree of obstruction.  Unfortunately she does not tolerate ICS very well so we will have to consider this if we decide to start maintenance therapy.  Bronchiectasis without acute exacerbation (HCC) Bronchiectasis with MAIC.  She is having sweating with exertion, no night sweats, no fevers.  No purulent mucus.  I do not think this is an exacerbation of bronchiectasis.  I think we need to repeat her CT chest to assess her degree of micronodular disease, activity of MAIC, for any progression of bronchiectasis.  She  agrees.  We will review next time  Levy Pupa, MD, PhD 04/03/2020, 5:19 PM [facility] [phone] or if no answer [phone]

## 2020-05-05 ENCOUNTER — Ambulatory Visit
Admission: RE | Admit: 2020-05-05 | Discharge: 2020-05-05 | Disposition: A | Discharge status: Home / Self Care | Payer: Medicare Other | Source: Ambulatory Visit | Attending: Emergency Medicine | Admitting: Emergency Medicine

## 2020-05-05 ENCOUNTER — Other Ambulatory Visit: Payer: Self-pay

## 2020-05-05 DIAGNOSIS — J479 Bronchiectasis, uncomplicated: Secondary | ICD-10-CM

## 2020-05-06 ENCOUNTER — Encounter: Payer: Self-pay | Admitting: Emergency Medicine

## 2020-05-06 ENCOUNTER — Ambulatory Visit (INDEPENDENT_AMBULATORY_CARE_PROVIDER_SITE_OTHER): Payer: Medicare Other | Admitting: Emergency Medicine

## 2020-05-06 DIAGNOSIS — J4520 Mild intermittent asthma, uncomplicated: Secondary | ICD-10-CM | POA: Diagnosis not present

## 2020-05-06 DIAGNOSIS — J479 Bronchiectasis, uncomplicated: Secondary | ICD-10-CM

## 2020-05-06 LAB — PULMONARY FUNCTION TEST
DL/VA % pred: 122 %
DL/VA: 4.86 ml/min/mmHg/L
DLCO cor % pred: 94 %
DLCO cor: 20.48 ml/min/mmHg
DLCO unc % pred: 94 %
DLCO unc: 20.48 ml/min/mmHg
FEF 25-75 Post: 1.96 L/sec
FEF 25-75 Pre: 1.41 L/sec
FEF2575-%Change-Post: 38 %
FEF2575-%Pred-Post: 109 %
FEF2575-%Pred-Pre: 78 %
FEV1-%Change-Post: 8 %
FEV1-%Pred-Post: 73 %
FEV1-%Pred-Pre: 67 %
FEV1-Post: 1.79 L
FEV1-Pre: 1.66 L
FEV1FVC-%Change-Post: 5 %
FEV1FVC-%Pred-Pre: 101 %
FEV6-%Change-Post: 3 %
FEV6-%Pred-Post: 72 %
FEV6-%Pred-Pre: 70 %
FEV6-Post: 2.25 L
FEV6-Pre: 2.17 L
FEV6FVC-%Change-Post: 0 %
FEV6FVC-%Pred-Post: 105 %
FEV6FVC-%Pred-Pre: 104 %
FVC-%Change-Post: 2 %
FVC-%Pred-Post: 69 %
FVC-%Pred-Pre: 67 %
FVC-Post: 2.25 L
FVC-Pre: 2.18 L
Post FEV1/FVC ratio: 80 %
Post FEV6/FVC ratio: 100 %
Pre FEV1/FVC ratio: 76 %
Pre FEV6/FVC Ratio: 99 %
RV % pred: 116 %
RV: 2.96 L
TLC % pred: 86 %
TLC: 4.88 L

## 2020-05-06 NOTE — Progress Notes (Signed)
Full PFT performed today. °

## 2020-05-06 NOTE — Progress Notes (Signed)
Subjective:    Patient ID: Sophia Patterson, female    DOB: 02/17/1941, 79 y.o.   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  She has chronic cough, usually dry has a history of reflux it may be a contributor.  At our last visit I recommended that she start pantoprazole to see if she get benefit >> didn't change things very much. Overall her cough is better even off the PPI.  A CT scan of her chest from 09/15/2018 showed stable mild to moderate bronchiolitis consistent with stable MAIC, scattered mucoid impaction small left lower lobe nodular opacities, largest 4 mm. She uses her albuterol about 1x a week. Often associated with extreme temps, exertion. She loves to garden and yardwork - no longer able to do it in the hot weather. She had a flare in November 2020, associated w URI sx, was not checked for COVID. Was treated with prednisone. No other flares.   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  She is on scheduled bronchodilator therapy, uses albuterol as needed.  Most recent chest CT was 09/15/2018.  We performed a chest x-ray today which I reviewed, shows chronic bronchitic changes with out any evidence of evolving interstitial disease or consolidation. She has stable dry cough. Over the last 3 weeks she has had an increase in her dyspnea, wheeze, exertional sweating and increased fatigue. No night sweats or fevers. No exertional CP. May be benefiting from albuterol.   She has cardiac stress test in 2019 >> normal.   R OV 05/06/2020 --this follow-up visit 79 year old woman with fixed asthma, bronchiectasis with Mycobacterium avium colonization and associated chronic cough.  GERD is a  contributor as well.  She has had increased exertional dyspnea over the last 2 months without any new infectious type symptoms.  Based on this we repeated her pulmonary function testing today and I have reviewed.  This shows evidence for mixed obstruction and restriction on spirometry, FEV1 1.66 (67% predicted).  No significant bronchodilator response.  Lung volumes are normal.  Diffusion capacity is normal.  Her FEV1 was 2.03 L (73%) in 2006.  We also performed a CT scan of her chest today 05/06/2022 that I have reviewed, shows lingular cylindrical bronchiectasis, right middle lobe bronchiectasis with some mucus plugging and associated scar, moderately prominent mediastinal lymphadenopathy, scattered calcified granulomas.  No significant change compared with 09/15/2018.  She has not tolerated inhaled steroids in the past.  She does feel that she gets some benefit from albuterol. She notes today that her breathing has started to improve since the weather has cooled off. Her albuterol use has decreased. She is coughing, dry cough, about 10 x a day. Her sweats are improved.    Review of Systems As per HPI     Objective:   Physical Exam Vitals:   05/06/20 1454  BP: 118/70  Pulse: 69  Temp: (!) 97.3 F (36.3 C)  TempSrc: Temporal  SpO2: 97%  Weight: 151 lb (68.5 kg)  Height: 5\' 8"  (1.727 m)   Gen: Pleasant, well-nourished, thin elderly woman, in no distress,  normal affect  ENT: No lesions,  mouth clear,  oropharynx clear, no postnasal drip  Neck: No JVD, no stridor  Lungs: No use of accessory  muscles, clear with exception of a few mild inspiratory crackles at left base  Cardiovascular: RRR, early systolic and diastolic M, no peripheral edema  Musculoskeletal: Dupuytren's contracture left hand, no cyanosis or clubbing  Neuro: alert, non focal  Skin: Warm, no lesions or rashes      Assessment & Plan:  Bronchiectasis without acute exacerbation (HCC) Her CT chest is stable.  Her  cough is persistent but nonproductive.  I do not believe that this is being driven by bronchiectasis or her Mycobacterium avium.  Hold off on any therapy for this at this time.  We will follow her imaging intermittently  Asthma Improved.  I think we can continue to treat with albuterol as needed.  Talked to her today about pretreating exertion.  She has not tolerated ICS in the past due to thrush, no indication to start at this time.  We may need to reconsider her regimen next summer because it was the heat and humidity that seem to make her dyspnea worse.  Levy Pupa, MD, PhD 05/06/2020, 3:30 PM [facility] [phone] or if no answer [phone]

## 2020-05-06 NOTE — Patient Instructions (Signed)
Keep your albuterol available use 2 puffs if needed for shortness of breath, chest tightness, wheezing.  You can consider using 2 puffs before you go outside into the heat or to exert yourself to see if this prevents any shortness of breath. We do not need to start a scheduled inhaler medication at this time We do not need to pursue treatment of your Mycobacterium avium at this time.  We will continue to follow.  Follow with Dr Delton Coombes in 6 months or sooner if you have any problems

## 2020-05-06 NOTE — Assessment & Plan Note (Signed)
Her CT chest is stable.  Her cough is persistent but nonproductive.  I do not believe that this is being driven by bronchiectasis or her Mycobacterium avium.  Hold off on any therapy for this at this time.  We will follow her imaging intermittently

## 2020-05-06 NOTE — Assessment & Plan Note (Signed)
Improved.  I think we can continue to treat with albuterol as needed.  Talked to her today about pretreating exertion.  She has not tolerated ICS in the past due to thrush, no indication to start at this time.  We may need to reconsider her regimen next summer because it was the heat and humidity that seem to make her dyspnea worse.

## 2020-06-21 ENCOUNTER — Other Ambulatory Visit: Payer: Self-pay | Admitting: Emergency Medicine

## 2020-08-12 ENCOUNTER — Telehealth: Payer: Self-pay | Admitting: Emergency Medicine

## 2020-08-12 DIAGNOSIS — J479 Bronchiectasis, uncomplicated: Secondary | ICD-10-CM

## 2020-08-13 NOTE — Telephone Encounter (Signed)
RB pt is wanting to schedule CT scan for 4/22---her last CT was 10/21 and there was not mention of scheduling another CT scan around April.  Please advise. Thanks

## 2020-08-15 NOTE — Telephone Encounter (Signed)
OK to order Ct chest without contrast for bronchiectasis in April/

## 2020-08-15 NOTE — Telephone Encounter (Signed)
Patient informed of CT scan order placed for 10/2020.

## 2020-08-15 NOTE — Telephone Encounter (Signed)
Order for CT chest placed.   lmtcb for pt to make aware.

## 2020-09-17 ENCOUNTER — Telehealth: Payer: Self-pay | Admitting: Emergency Medicine

## 2020-09-24 NOTE — Telephone Encounter (Signed)
Pt is scheduled for 4/25 @ 1 @ [facility] [address] location.  Pt given appt. Info.  Nothing further needed at this time.

## 2020-11-10 ENCOUNTER — Ambulatory Visit
Admission: RE | Admit: 2020-11-10 | Discharge: 2020-11-10 | Disposition: A | Discharge status: Home / Self Care | Payer: Medicare Other | Source: Ambulatory Visit | Attending: Emergency Medicine | Admitting: Emergency Medicine

## 2020-11-10 ENCOUNTER — Other Ambulatory Visit: Payer: Self-pay

## 2020-11-10 DIAGNOSIS — J479 Bronchiectasis, uncomplicated: Secondary | ICD-10-CM

## 2020-11-11 ENCOUNTER — Ambulatory Visit (INDEPENDENT_AMBULATORY_CARE_PROVIDER_SITE_OTHER): Payer: Medicare Other | Admitting: Emergency Medicine

## 2020-11-11 ENCOUNTER — Encounter: Payer: Self-pay | Admitting: Emergency Medicine

## 2020-11-11 DIAGNOSIS — J4520 Mild intermittent asthma, uncomplicated: Secondary | ICD-10-CM | POA: Diagnosis not present

## 2020-11-11 DIAGNOSIS — J479 Bronchiectasis, uncomplicated: Secondary | ICD-10-CM

## 2020-11-11 NOTE — Progress Notes (Signed)
Subjective:    Patient ID: Sophia Patterson, female    DOB: 1941/05/17, 80 y.o.   MRN: [id number]  HPI  ROV 05/06/2020 --this follow-up visit 80 year old woman with fixed asthma, bronchiectasis with Mycobacterium avium colonization and associated chronic cough.  GERD is a contributor as well.  She has had increased exertional dyspnea over the last 2 months without any new infectious type symptoms.  Based on this we repeated her pulmonary function testing today and I have reviewed.  This shows evidence for mixed obstruction and restriction on spirometry, FEV1 1.66 (67% predicted).  No significant bronchodilator response.  Lung volumes are normal.  Diffusion capacity is normal.  Her FEV1 was 2.03 L (73%) in 2006.  We also performed a CT scan of her chest today 05/06/2022 that I have reviewed, shows lingular cylindrical bronchiectasis, right middle lobe bronchiectasis with some mucus plugging and associated scar, moderately prominent mediastinal lymphadenopathy, scattered calcified granulomas.  No significant change compared with 09/15/2018.  She has not tolerated inhaled steroids in the past.  She does feel that she gets some benefit from albuterol. She notes today that her breathing has started to improve since the weather has cooled off. Her albuterol use has decreased. She is coughing, dry cough, about 10 x a day. Her sweats are improved.    ROV 11/11/20 --follow-up visit for 80 year old woman with bronchiectasis and Mycobacterium avium colonization, fixed asthma.  She has chronic cough in the setting of the above as well as GERD. Has not tolerated ICS in the past, does get some benefit from albuterol.  She has cylindrical bronchiectasis in the lingula, right middle lobe with some mucous plugging and associated scar, prominent mediastinal lymphadenopathy and some scattered calcified granulomatous disease.  She reports that she is doing well, denies SOB, remains active. She does have some trouble in  the heat - when gardening, working outside, especially in the Summer. She is not coughing.   Repeat CT chest performed 11/10/2020 reviewed by me, shows persistent lingular and right middle lobe volume loss with bronchiectasis, small calcified pulmonary nodules consistent with prior granulomatous disease including a new 5 mm left lower lobe nodule   Never smoker, no family hx lung cancer.    Review of Systems As per HPI     Objective:   Physical Exam Vitals:   11/11/20 0947  BP: 120/70  Pulse: 62  Temp: (!) 97.3 F (36.3 C)  TempSrc: Temporal  SpO2: 97%  Weight: 148 lb (67.1 kg)  Height: 5\' 7"  (1.702 m)    Gen: Pleasant, well-nourished, thin elderly woman, in no distress,  normal affect  ENT: No lesions,  mouth clear,  oropharynx clear, no postnasal drip  Neck: No JVD, no stridor  Lungs: No use of accessory muscles, clear with exception of a few mild inspiratory crackles at left base.  No wheeze on forced expiration  Cardiovascular: RRR, early systolic and diastolic M, no peripheral edema  Musculoskeletal: Dupuytren's contracture left hand, no cyanosis or clubbing  Neuro: alert, non focal  Skin: Warm, no lesions or rashes      Assessment & Plan:  Bronchiectasis without acute exacerbation (HCC) Stable by CT chest from yesterday.  No evidence of progression, mucus impaction.  She does not have a cough burden or secretion burden.  Discussed following her symptomatically, deferring scheduled repeat.  She is more comfortable ensuring that nothing is changing on her imaging and would like to continue to follow.  We will repeat her CT in 1 year.  Asthma Overall stable.  She does well in the cool weather.  She is concerned about her breathing in the summer.  We talked about pretreating exertion with her albuterol.  She has not tolerated ICS in the past due to thrush.  We could consider a retrial at some point going forward if she has a clinical change.  Levy Pupa, MD,  PhD 11/11/2020, 10:18 AM [facility] [phone] or if no answer [phone]

## 2020-11-11 NOTE — Patient Instructions (Signed)
Please keep your albuterol available to use 2 puffs if needed for shortness of breath, chest tightness, wheezing.  You can try pretreating exertion about 10 to 15 minutes.  Try taking it before you are working outside or gardening to see if this makes her task easier. We will plan to repeat your CT scan of the chest without contrast in April 2023 Follow with Dr. Delton Coombes in 12 months or sooner if you have any problems.

## 2020-11-11 NOTE — Addendum Note (Signed)
Addended by: Dorisann Frames R on: 11/11/2020 10:26 AM   Modules accepted: Orders

## 2020-11-11 NOTE — Assessment & Plan Note (Signed)
Overall stable.  She does well in the cool weather.  She is concerned about her breathing in the summer.  We talked about pretreating exertion with her albuterol.  She has not tolerated ICS in the past due to thrush.  We could consider a retrial at some point going forward if she has a clinical change.

## 2020-11-11 NOTE — Assessment & Plan Note (Signed)
Stable by CT chest from yesterday.  No evidence of progression, mucus impaction.  She does not have a cough burden or secretion burden.  Discussed following her symptomatically, deferring scheduled repeat.  She is more comfortable ensuring that nothing is changing on her imaging and would like to continue to follow.  We will repeat her CT in 1 year.

## 2021-10-09 ENCOUNTER — Other Ambulatory Visit: Payer: Self-pay

## 2021-10-09 DIAGNOSIS — J479 Bronchiectasis, uncomplicated: Secondary | ICD-10-CM

## 2021-11-11 ENCOUNTER — Other Ambulatory Visit: Payer: Medicare Other

## 2021-11-16 ENCOUNTER — Ambulatory Visit
Admission: RE | Admit: 2021-11-16 | Discharge: 2021-11-16 | Disposition: A | Discharge status: Home / Self Care | Payer: Medicare Other | Source: Ambulatory Visit | Attending: Emergency Medicine | Admitting: Emergency Medicine

## 2021-11-16 ENCOUNTER — Inpatient Hospital Stay: Admission: RE | Admit: 2021-11-16 | Payer: Medicare Other | Source: Ambulatory Visit

## 2021-11-16 DIAGNOSIS — J479 Bronchiectasis, uncomplicated: Secondary | ICD-10-CM

## 2021-11-17 ENCOUNTER — Ambulatory Visit (INDEPENDENT_AMBULATORY_CARE_PROVIDER_SITE_OTHER): Payer: Medicare Other | Admitting: Emergency Medicine

## 2021-11-17 ENCOUNTER — Encounter: Payer: Self-pay | Admitting: Emergency Medicine

## 2021-11-17 DIAGNOSIS — J479 Bronchiectasis, uncomplicated: Secondary | ICD-10-CM

## 2021-11-17 DIAGNOSIS — J4520 Mild intermittent asthma, uncomplicated: Secondary | ICD-10-CM | POA: Diagnosis not present

## 2021-11-17 DIAGNOSIS — J301 Allergic rhinitis due to pollen: Secondary | ICD-10-CM | POA: Diagnosis not present

## 2021-11-17 NOTE — Assessment & Plan Note (Signed)
No benefit from ICS in the past.  Plan continue albuterol as needed. ?

## 2021-11-17 NOTE — Progress Notes (Signed)
? ?  Subjective:  ? ? Patient ID: Sophia Patterson, female    DOB: 01/26/1941, 81 y.o.   MRN: [id number] ? ?HPI ? ?ROV 11/11/20 --follow-up visit for 81 year old woman with bronchiectasis and Mycobacterium avium colonization, fixed asthma.  She has chronic cough in the setting of the above as well as GERD. Has not tolerated ICS in the past, does get some benefit from albuterol.  She has cylindrical bronchiectasis in the lingula, right middle lobe with some mucous plugging and associated scar, prominent mediastinal lymphadenopathy and some scattered calcified granulomatous disease. ? ?She reports that she is doing well, denies SOB, remains active. She does have some trouble in the heat - when gardening, working outside, especially in the Summer. She is not coughing.  ? ?Repeat CT chest performed 11/10/2020 reviewed by me, shows persistent lingular and right middle lobe volume loss with bronchiectasis, small calcified pulmonary nodules consistent with prior granulomatous disease including a new 5 mm left lower lobe nodule  ? ? ?ROV 11/17/21 --81 year old woman with a history of bronchiectasis and Mycobacterium avium colonization.  She has fixed asthma, chronic cough due to the above as well as GERD.  She uses albuterol as needed, has not benefited from ICS in the past. She had a single flare since last last year, about 2 months ago when Spring pollen began. Treated effectively w abx, no steroids. Her cough is now improved, has some dry cough every day.  ?Uses flonase prn, allegra qd ? ?Repeat CT chest done 11/16/2021 reviewed by me, shows scattered bronchiectasis bilaterally with some associated airway plugging, little change from 1 year ago.  Medial left lower lobe 5 mm pulmonary nodule has resolved. ? ? ?Review of Systems ?As per HPI ? ?   ?Objective:  ? Physical Exam ?Vitals:  ? 11/17/21 1103  ?BP: 126/72  ?Pulse: 66  ?Temp: 97.9 ?F (36.6 ?C)  ?TempSrc: Oral  ?SpO2: 96%  ?Weight: 150 lb 9.6 oz (68.3 kg)  ?Height: 5\' 7"   (1.702 m)  ? ? ?Gen: Pleasant, well-nourished, thin elderly woman, in no distress,  normal affect ? ?ENT: No lesions,  mouth clear,  oropharynx clear, no postnasal drip ? ?Neck: No JVD, no stridor ? ?Lungs: No use of accessory muscles, right inspiratory squeak, no wheezing or crackles ? ?Cardiovascular: RRR, early systolic and diastolic M, no peripheral edema ? ?Musculoskeletal: Dupuytren's contracture left hand, no cyanosis or clubbing ? ?Neuro: alert, non focal ? ?Skin: Warm, no lesions or rashes ? ? ?   ?Assessment & Plan:  ?Bronchiectasis without acute exacerbation (HCC) ?We reviewed your CT scan of the chest today.  It shows stable inflammatory changes.  Your small left lower lobe pulmonary nodule has resolved.  Good news.  We will plan to repeat your CT without contrast in 1 year to follow your bronchiectasis. ?Keep your albuterol available to use 2 puffs if needed for shortness of breath, chest tightness, wheezing. ?Follow Dr. in 1 year or sooner if you have any problems. ? ?Allergic rhinitis ?Please continue your Allegra 100 mg once daily. ?Consider using your fluticasone nasal spray, 2 sprays each nostril once daily on a schedule during the allergy season.  You can stop it when allergies are not active. ? ?Asthma ?No benefit from ICS in the past.  Plan continue albuterol as needed. ? ?Delton Coombes, MD, PhD ?11/17/2021, 11:29 AM ?[facility] ?[phone] or if no answer [phone] ? ?

## 2021-11-17 NOTE — Patient Instructions (Signed)
We reviewed your CT scan of the chest today.  It shows stable inflammatory changes.  Your small left lower lobe pulmonary nodule has resolved.  Good news.  We will plan to repeat your CT without contrast in 1 year to follow your bronchiectasis. ?Please continue your Allegra 100 mg once daily. ?Consider using your fluticasone nasal spray, 2 sprays each nostril once daily on a schedule during the allergy season.  You can stop it when allergies are not active. ?Keep your albuterol available to use 2 puffs if needed for shortness of breath, chest tightness, wheezing. ?Follow Dr. Lamonte Sakai in 1 year or sooner if you have any problems. ?

## 2021-11-17 NOTE — Assessment & Plan Note (Signed)
We reviewed your CT scan of the chest today.  It shows stable inflammatory changes.  Your small left lower lobe pulmonary nodule has resolved.  Good news.  We will plan to repeat your CT without contrast in 1 year to follow your bronchiectasis. ?Keep your albuterol available to use 2 puffs if needed for shortness of breath, chest tightness, wheezing. ?Follow Dr. Delton Coombes in 1 year or sooner if you have any problems. ?

## 2021-11-17 NOTE — Assessment & Plan Note (Signed)
Please continue your Allegra 100 mg once daily. ?Consider using your fluticasone nasal spray, 2 sprays each nostril once daily on a schedule during the allergy season.  You can stop it when allergies are not active. ?

## 2021-11-23 ENCOUNTER — Other Ambulatory Visit: Payer: Self-pay | Admitting: Emergency Medicine

## 2022-05-28 ENCOUNTER — Telehealth: Payer: Self-pay | Admitting: Emergency Medicine

## 2022-05-28 NOTE — Telephone Encounter (Signed)
Called and spoke with patient. Patient stated that she wants to know if the doctor thinks she should get the RSV vaccine.   RB, please advise.

## 2022-06-02 NOTE — Telephone Encounter (Signed)
Called and spoke with patient. Patient verbalized understanding. Nothing further needed.  

## 2022-06-02 NOTE — Telephone Encounter (Signed)
Yes, I am glad she is interested in getting it. I believe she would benefit from the RSV vaccine

## 2022-12-06 ENCOUNTER — Telehealth: Payer: Self-pay | Admitting: Emergency Medicine

## 2022-12-06 NOTE — Telephone Encounter (Signed)
Patient would like to schedule CT scan. Patient scheduled 02/02/2023 with Dr. Delton Coombes. Patient phone number is [phone].

## 2022-12-07 NOTE — Telephone Encounter (Signed)
Hey I need a order placed to set this ct up I do not see an open order

## 2022-12-08 ENCOUNTER — Other Ambulatory Visit: Payer: Self-pay

## 2022-12-08 DIAGNOSIS — J479 Bronchiectasis, uncomplicated: Secondary | ICD-10-CM

## 2022-12-08 NOTE — Telephone Encounter (Signed)
I will call the patient and schedule the Ct

## 2022-12-08 NOTE — Telephone Encounter (Signed)
Order for CT has been placed 

## 2022-12-15 ENCOUNTER — Telehealth: Payer: Self-pay | Admitting: Emergency Medicine

## 2022-12-15 NOTE — Telephone Encounter (Signed)
PT worried she will not be sched for her CT before her July appt.. I do not see an active order for one. Please check behind me. No need to call PT.

## 2022-12-16 NOTE — Telephone Encounter (Signed)
CT order was placed on 12/06/2022, per telephone encounter. Can you call and get it scheduled. Thank you!

## 2022-12-17 NOTE — Telephone Encounter (Signed)
This is scheduled

## 2023-01-22 IMAGING — CT CT CHEST W/O CM
1 of 2 series · 14 of 32 positions shown, 18 images · non-contrast
Comparison: 11/10/2020

CLINICAL DATA: Bronchiectasis.  Asthma and chronic cough.



[Series 6: super d · axial · 0.72mm/px · z∈[-381,-82]mm · 14 of 418 slices shown, 18 images]
[im 22/418  mediastinal]
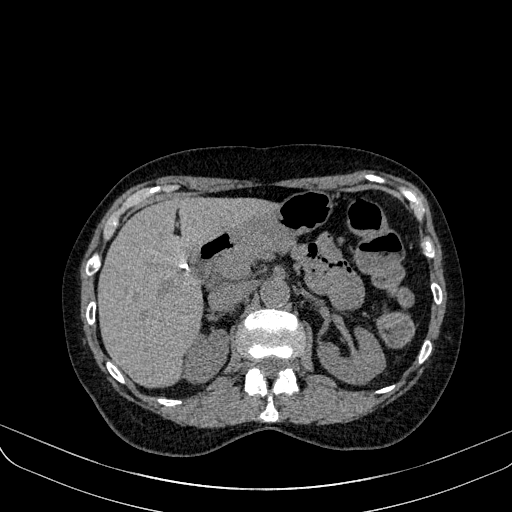
[im 22/418  lung]
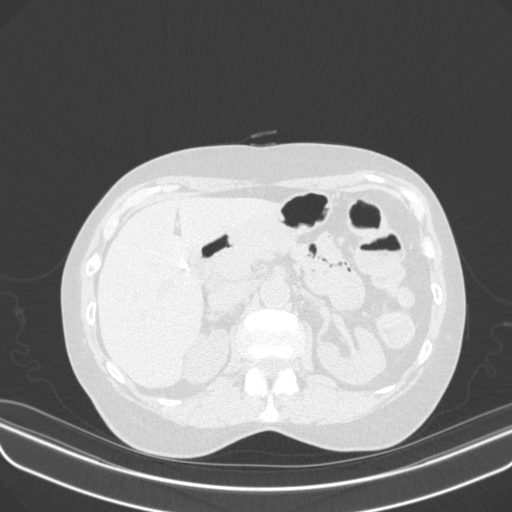
[im 66/418  lung]
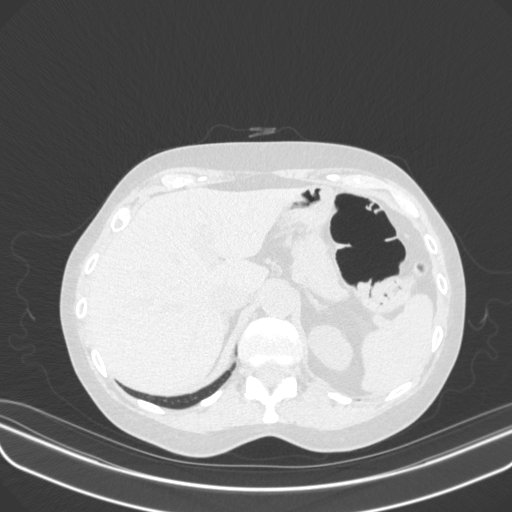
[im 88/418  lung]
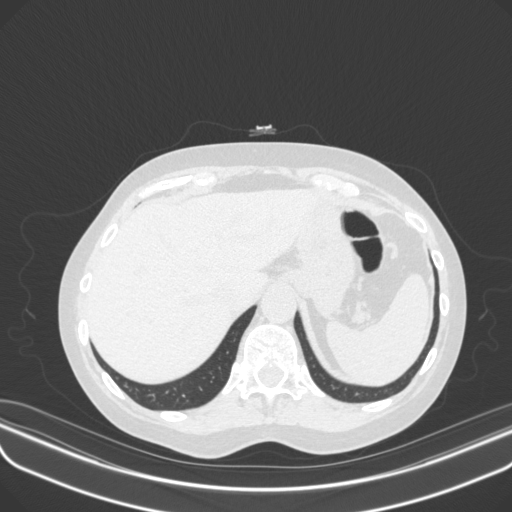
[im 132/418  lung]
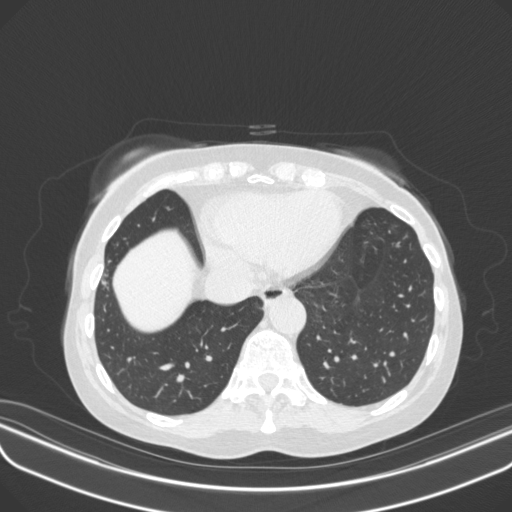
[im 140/418  mediastinal]
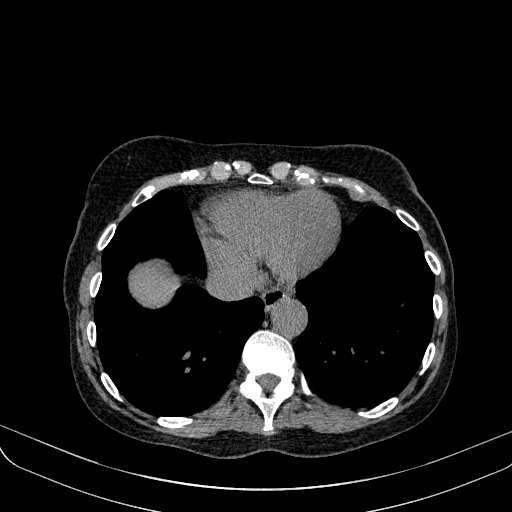
[im 140/418  lung]
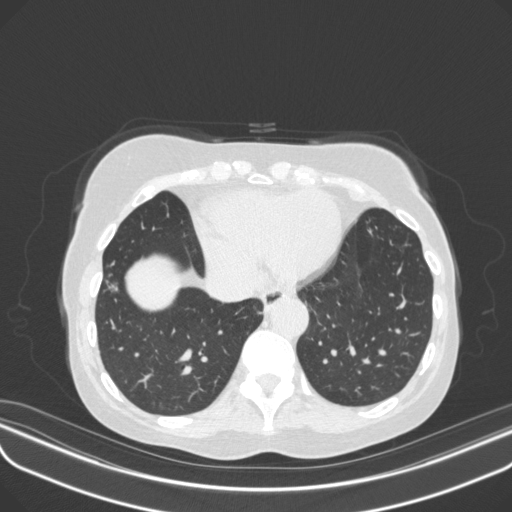
[im 176/418  lung]
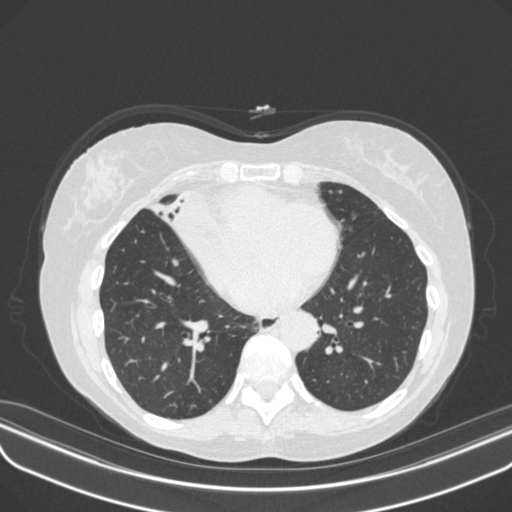
[im 197/418  lung]
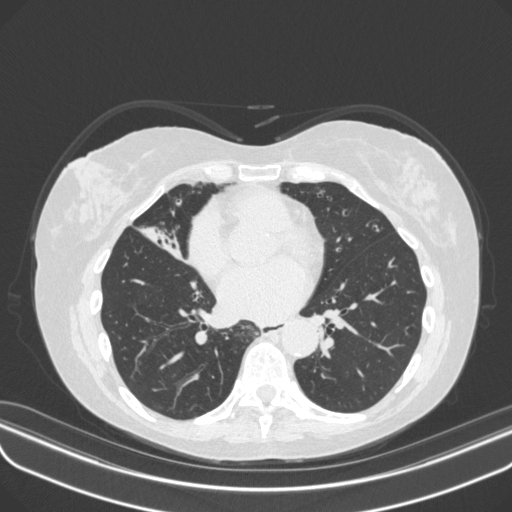
[im 220/418  lung]
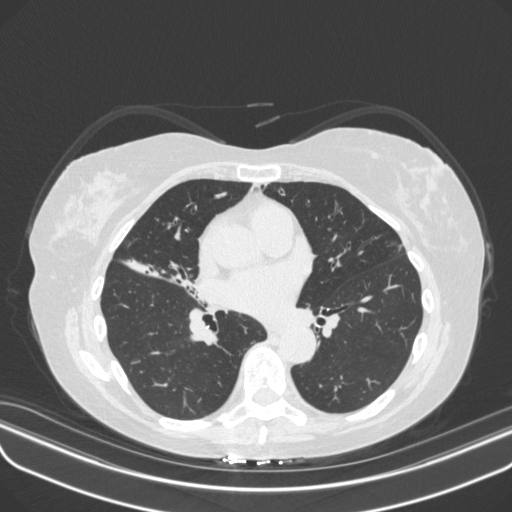
[im 242/418  mediastinal]
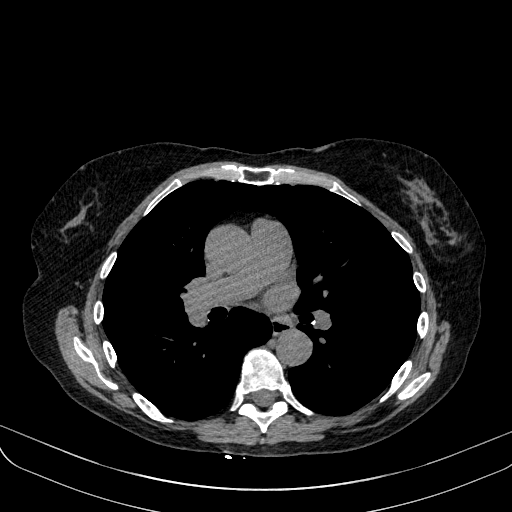
[im 242/418  lung]
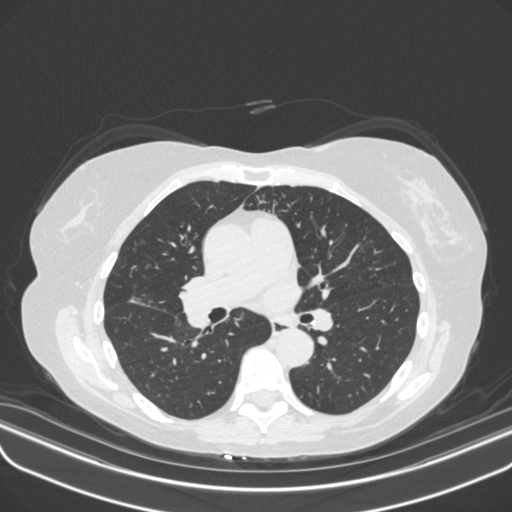
[im 279/418  lung]
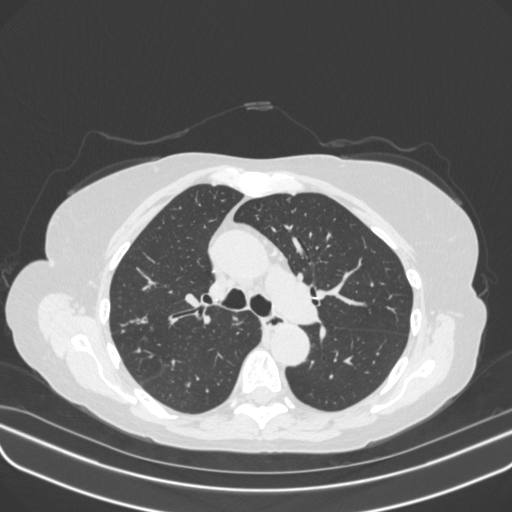
[im 286/418  lung]
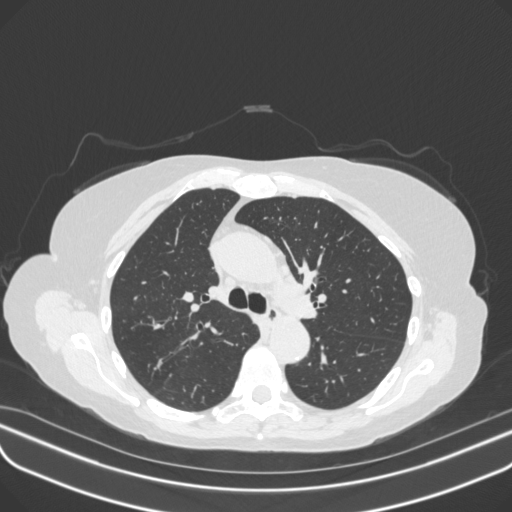
[im 330/418  lung]
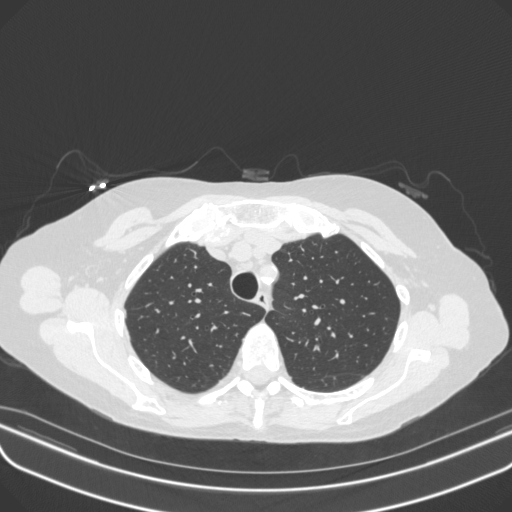
[im 352/418  mediastinal]
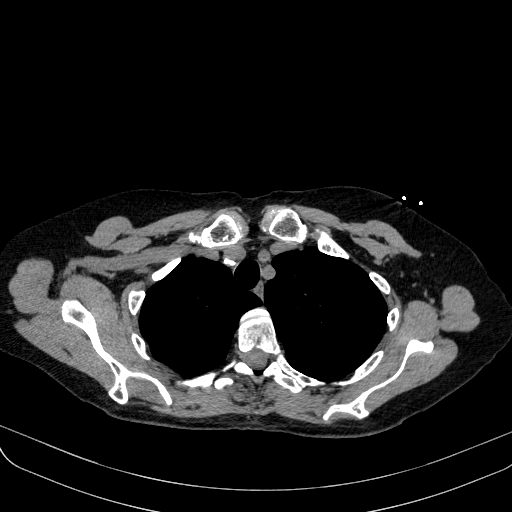
[im 352/418  lung]
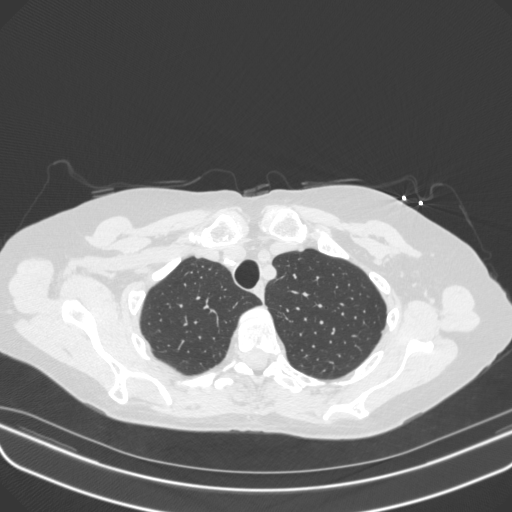
[im 396/418  lung]
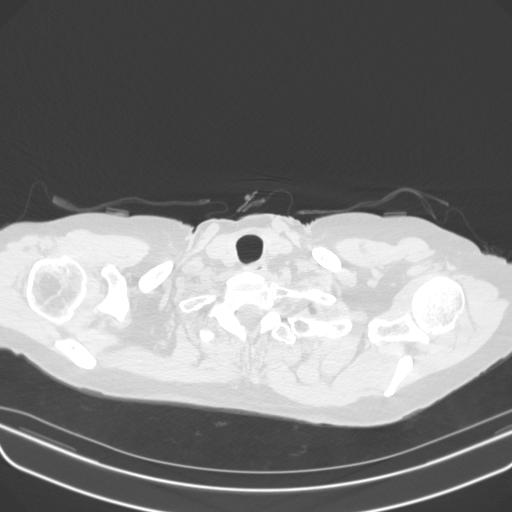

[14 of 32 positions shown; findings below may reference images not displayed]

FINDINGS: Cardiovascular: Atherosclerotic calcification of the thoracic aorta
and branch vessels.

Mediastinum/Nodes: Index right paratracheal lymph node 0.8 cm in
short axis on image 40 series 2, previously 0.9 cm. No overtly
pathologic thoracic adenopathy is identified

Lungs/Pleura: Stable biapical pleuroparenchymal scarring.

Cylindrical and some saccular bronchiectasis posteriorly in the
right upper lobe with associated airway plugging similar to the
prior exam. Substantial volume loss and air bronchograms in the
right middle lobe with associated cylindrical bronchiectasis. Airway
plugging and cylindrical bronchiectasis anteriorly in the right
upper lobe.

Tree-in-bud reticulonodular opacities laterally in the right lower
lobe as on image 112 series 5, roughly similar to [DATE] [DATE] and
likely from atypical infectious bronchiolitis.

Cylindrical bronchiectasis and airway plugging in the lingula
particularly medially and inferiorly, similar to previous.
Tree-in-bud reticulonodular opacities are minimally increased
anteriorly in the left lower lobe for example on image 120 series 5,
pattern compatible with atypical infectious bronchiolitis. There is
some mild posterior bronchiectasis in the left upper lobe for
example on image 65 series 5 similar to prior. The medial left lower
lobe 5 mm nodule noted on the 11/10/2020 exam has intervally
resolved.

Upper Abdomen: Cholecystectomy. Abdominal aortic atherosclerotic
calcification.

Musculoskeletal: Small sclerotic lesions favoring bone islands in
the thoracic spine and right scapula, unchanged.
IMPRESSION: 1. Scattered bronchiectasis in the lungs with associated airway
plugging and some scattered atypical infectious bronchiolitis in the
lungs. This is generally similar to the prior exam. Incidentally,
the medial left lower lobe 5 mm nodule noted on the prior exam has
intervally resolved. Lymph nodes in the chest are in the upper
normal range.
2.  Aortic Atherosclerosis ([id number]).

## 2023-01-25 ENCOUNTER — Other Ambulatory Visit: Payer: Medicare Other

## 2023-02-01 ENCOUNTER — Ambulatory Visit
Admission: RE | Admit: 2023-02-01 | Discharge: 2023-02-01 | Disposition: A | Discharge status: Home / Self Care | Payer: Medicare Other | Source: Ambulatory Visit | Attending: Emergency Medicine | Admitting: Emergency Medicine

## 2023-02-01 DIAGNOSIS — J479 Bronchiectasis, uncomplicated: Secondary | ICD-10-CM

## 2023-02-02 ENCOUNTER — Ambulatory Visit: Payer: Medicare Other | Admitting: Emergency Medicine

## 2023-02-02 ENCOUNTER — Encounter: Payer: Self-pay | Admitting: Emergency Medicine

## 2023-02-02 VITALS — BP 124/76 | HR 74 | Temp 98.3°F | Ht 67.0 in | Wt 146.2 lb

## 2023-02-02 DIAGNOSIS — J479 Bronchiectasis, uncomplicated: Secondary | ICD-10-CM | POA: Diagnosis not present

## 2023-02-02 DIAGNOSIS — J4520 Mild intermittent asthma, uncomplicated: Secondary | ICD-10-CM

## 2023-02-02 NOTE — Assessment & Plan Note (Signed)
Continue albuterol as needed, hold off on ICS at this time.

## 2023-02-02 NOTE — Progress Notes (Signed)
   Subjective:    Patient ID: Sophia Patterson, female    DOB: [birth year], 82 y.o.   MRN: [id number]  HPI  ROV 11/17/21 --82 year old woman with a history of bronchiectasis and Mycobacterium avium colonization.  She has fixed asthma, chronic cough due to the above as well as GERD.  She uses albuterol as needed, has not benefited from ICS in the past. She had a single flare since last last year, about 2 months ago when Spring pollen began. Treated effectively w abx, no steroids. Her cough is now improved, has some dry cough every day.  Uses flonase prn, allegra qd  Repeat CT chest done 11/16/2021 reviewed by me, shows scattered bronchiectasis bilaterally with some associated airway plugging, little change from 1 year ago.  Medial left lower lobe 5 mm pulmonary nodule has resolved.  ROV 02/02/2023 --follow-up visit for 81 year old woman with a history of bronchiectasis and Mycobacterium avium colonization, associated fixed asthma, chronic cough due to this as well as GERD.  Today she reports that she is active, has been doing well - does have some SOB in the hot weather, but stable. She has a daily dry cough, 3-4x a day. No sputum production. She is now on Eliquis for A fib. Remains on allegra. She has albuterol, uses it 4-5x a month, usually for persistent cough.   CT chest done on 02/01/2023 reviewed by me, shows    Review of Systems As per HPI     Objective:   Physical Exam Vitals:   02/02/23 1317  BP: 124/76  Pulse: 74  Temp: 98.3 F (36.8 C)  TempSrc: Oral  SpO2: 96%  Weight: 146 lb 3.2 oz (66.3 kg)  Height: 5\' 7"  (1.702 m)    Gen: Pleasant, well-nourished, thin elderly woman, in no distress,  normal affect  ENT: No lesions,  mouth clear,  oropharynx clear, no postnasal drip  Neck: No JVD, no stridor  Lungs: No use of accessory muscles, right inspiratory squeak, no wheezing or crackles  Cardiovascular: RRR, early systolic and diastolic M, no peripheral  edema  Musculoskeletal: Dupuytren's contracture left hand, no cyanosis or clubbing  Neuro: alert, non focal  Skin: Warm, no lesions or rashes      Assessment & Plan:  Bronchiectasis without acute exacerbation (HCC) Overall she is doing well.  Her CT scan of the chest is stable.  She does have daily dry cough, question of silent GERD component (currently untreated).  She does use Allegra for the allergy component.  Albuterol seems to help the cough and she will continue to use as needed.  No role for scheduled BD therapy at this time.  Her CT chest has been stable, I think we can wait 2 years to repeated unless she develops clinical findings.  She will call me if so.  I will see her in a year to check-in.  Asthma Continue albuterol as needed, hold off on ICS at this time.   Levy Pupa, MD, PhD 02/02/2023, 1:33 PM [facility] [phone] or if no answer [phone]

## 2023-02-02 NOTE — Patient Instructions (Addendum)
We reviewed your CT scan of the chest.  This is stable compared with your priors.  Good news. Please keep your albuterol available to use 2 puffs of admit or shortness of breath, chest tightness, spells of coughing Continue your Allegra as you have been taking it Please call our office if you develop any increasing cough, increase sputum production or new respiratory symptoms. We will plan to follow-up in 1 year, sooner if you have problems.

## 2023-02-02 NOTE — Assessment & Plan Note (Signed)
Overall she is doing well.  Her CT scan of the chest is stable.  She does have daily dry cough, question of silent GERD component (currently untreated).  She does use Allegra for the allergy component.  Albuterol seems to help the cough and she will continue to use as needed.  No role for scheduled BD therapy at this time.  Her CT chest has been stable, I think we can wait 2 years to repeated unless she develops clinical findings.  She will call me if so.  I will see her in a year to check-in.

## 2023-02-09 ENCOUNTER — Telehealth: Payer: Self-pay | Admitting: Emergency Medicine

## 2023-02-09 MED ORDER — MOLNUPIRAVIR 200 MG PO CAPS: 4.0000 | ORAL_CAPSULE | Freq: Two times a day (BID) | ORAL | 0 refills | Status: AC

## 2023-02-09 MED ORDER — BENZONATATE 100 MG PO CAPS: 100.0000 mg | ORAL_CAPSULE | Freq: Four times a day (QID) | ORAL | 1 refills | Status: AC | PRN

## 2023-02-09 NOTE — Telephone Encounter (Signed)
Pt states she has tested Positive for COVID and wants Dr. Delton Coombes to send her in something Pharmacy: [facility]

## 2023-02-09 NOTE — Telephone Encounter (Signed)
Please let her know that I sent scripts for molnupiravir and for tessalon perles to her pharmacy

## 2023-02-09 NOTE — Telephone Encounter (Signed)
Spoke with patient regarding rx's and recommendations. Patient voiced understanding. No further questions or concerns.

## 2023-02-09 NOTE — Telephone Encounter (Signed)
Called and spoke with patient. She stated that she tested positive for covid this morning. Her symptoms started mid day yesterday with increased fatigue and chills. She woke up this morning and felt worse. She tested herself twice and both tests came back positive. She has now developed a non-productive cough, hoarseness, fever, fatigue and SOB with the slightest of activity. She has been taking Tylenol for her fever and chills.   She wanted to know if RB would be willing to send in something for her.   Pharmacy is [facility] in [place].   [place], can you please advise? Thanks!

## 2023-02-15 ENCOUNTER — Telehealth: Payer: Self-pay | Admitting: Emergency Medicine

## 2023-02-15 NOTE — Telephone Encounter (Signed)
Pt. Calling back wants to get Paxlovid for covid the tessalon pearls are working but she wants something else to help with covid symp. More please advise

## 2023-02-15 NOTE — Telephone Encounter (Signed)
ATC patient.  LM on VM to call back, if needed.    Per Dr. Delton Coombes  02/09/23  3:05 PM Note Please let her know that I sent scripts for molnupiravir and for tessalon perles to her pharmacy    Byrum, Les Pou, MD

## 2023-02-17 NOTE — Telephone Encounter (Signed)
Called the pt and there was no answer- left detailed msg and asked her to call back if any quesitons Nothing further needed

## 2023-03-10 ENCOUNTER — Ambulatory Visit: Payer: Medicare Other | Admitting: Nurse Practitioner

## 2023-06-07 ENCOUNTER — Other Ambulatory Visit: Payer: Self-pay | Admitting: Emergency Medicine

## 2024-06-29 ENCOUNTER — Ambulatory Visit: Admitting: Emergency Medicine
